# Patient Record
Sex: Female | Born: 1975 | Race: White | Hispanic: Yes | Marital: Married | Smoking: Never smoker
Health system: Southern US, Community
[De-identification: ages and names within clinical notes are randomized; demographics above are authoritative.]

## PROBLEM LIST (undated history)

## (undated) ENCOUNTER — Inpatient Hospital Stay (HOSPITAL_COMMUNITY): Payer: Self-pay

## (undated) DIAGNOSIS — O24419 Gestational diabetes mellitus in pregnancy, unspecified control: Secondary | ICD-10-CM

## (undated) DIAGNOSIS — Z789 Other specified health status: Secondary | ICD-10-CM

## (undated) HISTORY — PX: EYE MUSCLE SURGERY: SHX370

---

## 1998-01-14 ENCOUNTER — Ambulatory Visit (HOSPITAL_COMMUNITY): Admission: RE | Admit: 1998-01-14 | Discharge: 1998-01-14 | Payer: Self-pay | Admitting: Obstetrics

## 1998-05-08 ENCOUNTER — Inpatient Hospital Stay (HOSPITAL_COMMUNITY): Admission: AD | Admit: 1998-05-08 | Discharge: 1998-05-10 | Payer: Self-pay

## 1999-01-21 ENCOUNTER — Ambulatory Visit (HOSPITAL_COMMUNITY): Admission: RE | Admit: 1999-01-21 | Discharge: 1999-01-21 | Payer: Self-pay | Admitting: Obstetrics

## 1999-03-12 ENCOUNTER — Observation Stay (HOSPITAL_COMMUNITY): Admission: AD | Admit: 1999-03-12 | Discharge: 1999-03-13 | Payer: Self-pay | Admitting: *Deleted

## 1999-03-14 ENCOUNTER — Encounter: Payer: Self-pay | Admitting: *Deleted

## 1999-03-15 ENCOUNTER — Inpatient Hospital Stay (HOSPITAL_COMMUNITY): Admission: AD | Admit: 1999-03-15 | Discharge: 1999-03-21 | Payer: Self-pay | Admitting: *Deleted

## 2004-01-22 ENCOUNTER — Ambulatory Visit: Payer: Self-pay | Admitting: Internal Medicine

## 2004-02-27 ENCOUNTER — Ambulatory Visit: Payer: Self-pay | Admitting: Family Medicine

## 2005-10-24 ENCOUNTER — Ambulatory Visit: Payer: Self-pay | Admitting: Family Medicine

## 2006-01-31 ENCOUNTER — Ambulatory Visit: Payer: Self-pay | Admitting: Family Medicine

## 2006-05-01 ENCOUNTER — Ambulatory Visit (HOSPITAL_COMMUNITY): Admission: RE | Admit: 2006-05-01 | Discharge: 2006-05-01 | Payer: Self-pay | Admitting: Obstetrics & Gynecology

## 2006-05-18 ENCOUNTER — Ambulatory Visit: Payer: Self-pay | Admitting: Obstetrics & Gynecology

## 2006-05-22 ENCOUNTER — Ambulatory Visit: Payer: Self-pay | Admitting: Family Medicine

## 2006-05-29 ENCOUNTER — Ambulatory Visit: Payer: Self-pay | Admitting: Obstetrics & Gynecology

## 2006-06-05 ENCOUNTER — Ambulatory Visit: Payer: Self-pay | Admitting: Obstetrics & Gynecology

## 2006-06-14 ENCOUNTER — Ambulatory Visit: Payer: Self-pay | Admitting: Obstetrics & Gynecology

## 2006-06-19 ENCOUNTER — Ambulatory Visit: Payer: Self-pay | Admitting: Obstetrics & Gynecology

## 2006-06-28 ENCOUNTER — Ambulatory Visit: Payer: Self-pay | Admitting: Obstetrics and Gynecology

## 2006-07-03 ENCOUNTER — Ambulatory Visit: Payer: Self-pay | Admitting: Obstetrics & Gynecology

## 2006-07-10 ENCOUNTER — Ambulatory Visit: Payer: Self-pay | Admitting: Obstetrics & Gynecology

## 2006-07-17 ENCOUNTER — Ambulatory Visit: Payer: Self-pay | Admitting: Obstetrics & Gynecology

## 2006-07-24 ENCOUNTER — Ambulatory Visit: Payer: Self-pay | Admitting: Family Medicine

## 2006-07-31 ENCOUNTER — Ambulatory Visit: Payer: Self-pay | Admitting: Obstetrics and Gynecology

## 2006-08-07 ENCOUNTER — Ambulatory Visit: Payer: Self-pay | Admitting: Obstetrics & Gynecology

## 2006-08-14 ENCOUNTER — Ambulatory Visit: Payer: Self-pay | Admitting: Obstetrics & Gynecology

## 2006-08-21 ENCOUNTER — Ambulatory Visit: Payer: Self-pay | Admitting: Obstetrics & Gynecology

## 2006-08-28 ENCOUNTER — Ambulatory Visit: Payer: Self-pay | Admitting: Obstetrics & Gynecology

## 2006-09-07 ENCOUNTER — Ambulatory Visit: Payer: Self-pay | Admitting: Family Medicine

## 2006-09-09 ENCOUNTER — Ambulatory Visit: Payer: Self-pay | Admitting: *Deleted

## 2006-09-09 ENCOUNTER — Inpatient Hospital Stay (HOSPITAL_COMMUNITY): Admission: AD | Admit: 2006-09-09 | Discharge: 2006-09-10 | Payer: Self-pay | Admitting: Obstetrics & Gynecology

## 2010-03-30 ENCOUNTER — Encounter (INDEPENDENT_AMBULATORY_CARE_PROVIDER_SITE_OTHER): Payer: Self-pay | Admitting: *Deleted

## 2010-03-30 LAB — CONVERTED CEMR LAB
ALT: 12 units/L (ref 0–35)
AST: 16 units/L (ref 0–37)
Albumin: 4.3 g/dL (ref 3.5–5.2)
Alkaline Phosphatase: 63 units/L (ref 39–117)
BUN: 17 mg/dL (ref 6–23)
Basophils Absolute: 0 10*3/uL (ref 0.0–0.1)
Basophils Relative: 0 % (ref 0–1)
CO2: 24 meq/L (ref 19–32)
Calcium: 9.9 mg/dL (ref 8.4–10.5)
Chloride: 103 meq/L (ref 96–112)
Creatinine, Ser: 0.63 mg/dL (ref 0.40–1.20)
Eosinophils Absolute: 0.4 10*3/uL (ref 0.0–0.7)
Eosinophils Relative: 4 % (ref 0–5)
Glucose, Bld: 104 mg/dL — ABNORMAL HIGH (ref 70–99)
HCT: 39.8 % (ref 36.0–46.0)
Hemoglobin: 13.2 g/dL (ref 12.0–15.0)
Lymphocytes Relative: 41 % (ref 12–46)
Lymphs Abs: 4.3 10*3/uL — ABNORMAL HIGH (ref 0.7–4.0)
MCHC: 33.2 g/dL (ref 30.0–36.0)
MCV: 91.7 fL (ref 78.0–100.0)
Monocytes Absolute: 0.5 10*3/uL (ref 0.1–1.0)
Monocytes Relative: 5 % (ref 3–12)
Neutro Abs: 5.2 10*3/uL (ref 1.7–7.7)
Neutrophils Relative %: 50 % (ref 43–77)
Platelets: 323 10*3/uL (ref 150–400)
Potassium: 4.8 meq/L (ref 3.5–5.3)
RBC: 4.34 M/uL (ref 3.87–5.11)
RDW: 13 % (ref 11.5–15.5)
Sed Rate: 6 mm/hr (ref 0–22)
Sodium: 139 meq/L (ref 135–145)
Total Bilirubin: 0.2 mg/dL — ABNORMAL LOW (ref 0.3–1.2)
Total Protein: 7.2 g/dL (ref 6.0–8.3)
WBC: 10.5 10*3/uL (ref 4.0–10.5)

## 2010-05-05 ENCOUNTER — Encounter (INDEPENDENT_AMBULATORY_CARE_PROVIDER_SITE_OTHER): Payer: Self-pay | Admitting: *Deleted

## 2010-05-05 LAB — CONVERTED CEMR LAB: IgE (Immunoglobulin E), Serum: 46.5 intl units/mL (ref 0.0–180.0)

## 2010-08-27 NOTE — Discharge Summary (Signed)
[facility]  Patient:    Tricia Williams                       MRN: [id number] Adm. Date:  [id number] Disc. Date: [id number] Attending:  Michaelle Copas Dictator:   [id number]                           Discharge Summary  ATTENDING:                    Conni Elliot, M.D.  DISCHARGE DIAGNOSES:          Status post normal spontaneous vaginal delivery of a preterm infant Apgars 8 and 9.  Gestational age approximately 82 and 6/7 at delivery.  DISCHARGE MEDICATIONS:       1. Ibuprofen 800 mg q.8h. p.r.n.                               2. Prenatal vitamins one p.o. q.d.  HISTORY OF PRESENT ILLNESS:   Patient is a 35 year old G2, P1-1-0-2 who was admitted at 41 and 1/7 weeks complaining of vaginal bleeding and positive uterine contractions approximately every 10-20 minutes.  Patient did report fetal movement.  Was complaining of a pressure in her stomach.  Patient denied any leaking of fluid.  Patients past medical and OB history were otherwise noncontributory.  Patients initial examination showed her cervix to be 1 cm, 70%, and high.  HOSPITAL COURSE:              1. Status post normal spontaneous vaginal delivery.  Patient was admitted to the antenatal unit.  An ultrasound was done which showed a grade 1 placenta and an AFI was within normal limits.  Patient was started on IV fluids and given subcutaneous terbutaline.  Patient, however, continued to have contractions and some cervical change was noted so patient was placed on magnesium and Unasyn for preterm labor.  Patient was continued on strict bed rest.  Magnesium was continued.  Patient remained afebrile; however, patient continued to have some uterine contractions and continued to have cervical change.  On March 19, 1999 patients cervix was 7 cm, 85%, and -1.  Therefore, patient was determined to be in active labor and was transferred to labor and delivery.  In labor and delivery patient  was given Stadol and Phenergan for pain and later given an epidural. Patient artificially ruptured her membranes that showed very white meconium.  Patient proceeded to have a normal spontaneous vaginal delivery of a viable female infant in a vertex presentation with Apgars of 9 and 9.  Patient had a routine postpartum course.  On discharge patient was breast and bottle feeding and received Depo-Provera prior to discharge for birth control.  Patient and baby were both discharged in stable condition. DD:  05/24/99 TD:  05/24/99 Job: [id number]

## 2011-06-25 ENCOUNTER — Ambulatory Visit: Payer: Self-pay | Admitting: Family Medicine

## 2011-06-25 VITALS — BP 111/66 | HR 56 | Temp 98.9°F | Resp 20 | Ht 61.0 in | Wt 139.2 lb

## 2011-06-25 DIAGNOSIS — IMO0002 Reserved for concepts with insufficient information to code with codable children: Secondary | ICD-10-CM

## 2011-06-25 DIAGNOSIS — Z9109 Other allergy status, other than to drugs and biological substances: Secondary | ICD-10-CM

## 2011-06-25 DIAGNOSIS — J309 Allergic rhinitis, unspecified: Secondary | ICD-10-CM

## 2011-06-25 DIAGNOSIS — L509 Urticaria, unspecified: Secondary | ICD-10-CM

## 2011-06-25 MED ORDER — HYDROXYZINE PAMOATE 50 MG PO CAPS: ORAL_CAPSULE | ORAL | Status: DC

## 2011-06-25 MED ORDER — CEPHALEXIN 500 MG PO CAPS: 500.0000 mg | ORAL_CAPSULE | Freq: Three times a day (TID) | ORAL | Status: AC

## 2011-06-25 NOTE — Patient Instructions (Addendum)
Ronchas (Hives) Las ronchas (urticaria) son manchas rojas hinchadas en la piel, que pican. Pueden cambiar de tamao, forma, ubicacin y Physiological scientist. Las ronchas que se producen en las capas profundas de la piel pueden causar inflamacin en las manos, los pies y [place]. Las ronchas pueden ser una reaccin alrgica a algo que usted o su hijo hayan comido, tocado o hayan colocado sobre la piel. Las ronchas tambin pueden producirse como reaccin al fro, al calor, a las infecciones virales, a las drogas, a las picaduras de [place], o a las situaciones de Librarian, academic. Es frecuente que no se Oceanographer. Las ronchas pueden durar desde [address] a Psychologist, educational. No es un trastorno contagioso. INSTRUCCIONES PARA EL CUIDADO DOMICILIARIO  Si conoce la causa de las ronchas, evite exponerse a esa fuente.   Para aliviar la picazn y la erupcin:   Aplique compresas fras sobre la piel o tome baos de agua fra. No tome ni haga tomar a su hijo baos o duchas calientes porque el calor puede empeorar la picazn.   El mejor medicamento para las ronchas es el antihistamnico. El antihistamnico no curar las ronchas, Biomedical engineer las reducir. Puede utilizar antihistamnicos de [employer]. Este medicamento podr adormecer a su hijo. Los adolescentes no Doctor, hospital al [employer].   Utilice antihistamnico cada 6 horas o hasta que las ronchas hayan desaparecido completamente durante 24 horas, o segn le hayan indicado.   Podrn prescribirle otros medicamentos a su hijo para Associate Professor. Dle los medicamentos a su hijo Chief Operating Officer que lo asiste.   Usted o su hijo deben usar ropa suelta, inclusive la ropa interior. Las irritaciones de la piel pueden empeorar las ronchas.   Realice el seguimiento segn las instrucciones que le ha dado el profesional que lo asiste.  SOLICITE ATENCIN MDICA SI:  Usted o su hijo an sienten una picazn considerable  despus de [employer] (prescriptos o de Sales promotion account executive).   Existe hinchazn o dolor en las articulaciones.  SOLICITE ATENCIN MDICA DE INMEDIATO SI:  Tiene fiebre.   Nota inflamacin en los labios o en la lengua.   Existe dificultad al respirar o tragar, o siente "falta de espacio" en la garganta o en el pecho.   Siente dolor abdominal (en el vientre).   El nio acta como si estuviera enfermo.  Pueden ser los primeros signos de una reaccin alrgica que ponga en peligro la vida. ESTO ES UNA EMERGENCIA. Pida ayuda mdica al 911. EST SEGURO QUE:   Comprende las instrucciones para el alta mdica.   Controlar su enfermedad.   Solicitar atencin mdica de inmediato segn las indicaciones.  Document Released: 03/28/2005 Document Revised: 03/17/2011 [facility] Patient Information 2012 [place], [place].  Alergias, en general (Allergies, Generic) El profesional que lo asiste le ha diagnosticado que usted padece de [place]. Las [employer] pueden ser ocasionadas por cualquier cosa a la que su organismo es sensible. Pueden ser alimentos, medicamentos, polen, sustancias qumicas y casi cualquiera de las cosas que lo rodean en su vida diaria que producen alrgenos. Un alrgeno es todo lo que hace que una sustancia produzca alergia. La herencia es uno de los factores que causa este problema. Esto significa que usted puede sufrir alguna de las alergias que sufrieron sus [place]. Las [employer] a la comida pueden ocurrir a Actuary. Estn entre las ms graves y Engineering geologist en peligro la vida. Algunos de los alimentos que comnmente producen alergia son la [place]  de vaca, los frutos de mar, los Bird[place]in[place], los frutos secos, el trigo y la soja. SNTOMAS  Hinchazn alrededor de la boca.   Una erupcin roja que produce picazn o urticaria.   Vmitos o diarrea.   Dificultad para respirar.  LAS REACCIONES ALRGICAS GRAVES PONEN EN PELIGRO LA VIDA . Esta reaccin se denomina anafilaxis.  Puede ocasionar que la boca y la garganta se hinchen y produzca dificultad para respirar y Engineer, manufacturing. En reacciones graves, slo una pequea cantidad del alimento (por ejemplo, aceite de cacahuate en la ensalada) puede producir la muerte en pocos segundos. Las [employer]nic[employer] pueden ocurrir a Actuary. Se denominan as porque generalmente se producen durante la misma estacin todos los aos. Puede ser Neomia Dear reaccin al moho, al polen del csped o al polen de los rboles. Otras causas del problema son los alrgenos que contienen los caros del polvo del hogar, el pelaje de las mascotas y las esporas del moho. Los sntomas consisten en congestin nasal, picazn y secrecin nasal asociada con estornudos, y lagrimeo y [employer] ojos. Tambin puede haber picazn de la boca y los odos. Estos problemas aparecen cuando se entra en contacto con el polen y otros alrgenos. Los alrgenos son las partculas que estn en el aire y a las que el organismo reacciona cuando existe una Automotive engineer. Esto hace que usted libere anticuerpos alrgicos. A travs de una cadena de eventos, estos finalmente hacen que usted libere histamina en la corriente sangunea. Aunque esto implica una proteccin para su organismo, es lo que le produce disconfort. Ese es el motivo por el que se le han indicado antihistamnicos para sentirse mejor. Si usted no Counselling psychologist cul es el alrgeno que le produjo la reaccin, puede someterse a una prueba de [place] o de piel. Las alergias no pueden curarse pero pueden controlarse con medicamentos. La fiebre de heno es un grupo de trastornos alrgicos estacionales Simplemente se tratan con medicamentos de venta libre como difenhidramina (Benadryl). Tome los medicamentos segn las indicaciones. No consuma alcohol ni conduzca mientras toma este medicamento. Consulte con el profesional que lo asiste o siga las instrucciones de uso para las dosis para nios. Si estos medicamentos no le  Merchant navy officer, existen muchos otros nuevos que el profesional que lo asiste puede prescribirle. Podrn utilizarse medicamentos ms fuertes tales como un spray nasal, colirios y corticoides si los primeros medicamentos que prueba no lo Strawberry. Si todos estos fracasan, puede Chemical engineer otros tratamientos como la inmunoterapia o las inyecciones desensibilizantes. Haga una consulta de seguimiento con el profesional que lo asiste si los problemas continan. Estas alergias estacionales no ponen en peligro la vida. Generalmente se trata de una incomodidad que puede aliviarse con medicamentos. INSTRUCCIONES PARA EL CUIDADO DOMICILIARIO  Si no est seguro de que es lo que le produce la reaccin, Audiological scientist un registro de los alimentos que come y los sntomas que le siguen. Evite los Personal assistant.   Si presenta urticaria o una erupcin cutnea:   Tome los medicamentos como se le indic.   Puede utilizar un antihistamnico de venta libre (difenhidramina) para la urticaria y Higher education careers adviser, segn sea necesario.   Aplquese compresas sobre la piel o tome baos de agua fra. Evite los baos o las duchas calientes. El calor puede hacer que la urticaria y la picazn empeoren.   Si usted es muy alrgico:   Como consecuencia de un tratamiento para una reaccin grave, puede necesitar ser hospitalizado para recibir un seguimiento intensivo.  Utilice un brazalete o collar de alerta mdico, indicando que usted es Best boy.   Usted y su familia deben aprender a Building services engineer adrenalina o a Chemical engineer un kit anafilctico.   Si usted ya ha sufrido una reaccin grave, siempre lleve el kit anafilctico o el EpiPen con usted. Si sufre una reaccin grave, utilice esta medicacin del modo en que se lo indic el profesional que lo asiste. Una falla puede conllevar consecuencias fatales.  SOLICITE ATENCIN MDICA SI:  Sospecha que puede sufrir una alergia a algn alimento. Los sntomas generalmente ocurren  dentro de los 30 minutos posteriores a haber ingerido el alimento.   Los sntomas persistieron durante 2 [address]  [address] o han empeorado.   Desarrolla nuevos sntomas.   Quiere volver a probar o que su hijo consuma nuevamente un alimento o bebida que usted cree que le causa una reaccin Counselling psychologist. Nunca lo haga si ha sufrido una reaccin anafilctica a ese alimento o a esa bebida con anterioridad. Slo intntelo bajo la supervisin del mdico.  SOLICITE ATENCIN MDICA DE INMEDIATO SI:  Presenta dificultad para respirar, jadea o tiene una sensacin de opresin en el pecho o en la garganta.   Tiene la boca hinchada, o presenta urticaria, hinchazn o picazn en todo el cuerpo.   Ha sufrido una reaccin grave que ha respondido a Engineer, manufacturing systems o al EpiPen. Estas reacciones pueden volver a presentarse cuando haya terminado la medicacin. Estas reacciones deben considerarse como que ponen en peligro la vida.  EST SEGURO QUE:   Comprende las instrucciones para el alta mdica.   Controlar su enfermedad.   Solicitar atencin mdica de inmediato segn las indicaciones.  Document Released: 03/28/2005 Document Revised: 03/17/2011 [facility] Patient Information 2012 [place], [place].   Used the allergy pills when necessary. Consider trying over the counter Zyrtec. If the rash gets worse come back in for a recheck. Although we can send for allergy testing, often we do not find the cause of analogy. Your radio had a lot of testing done, and I do not want you to spend lots of money if no answers are going to be found. I think it is best to just try keep taking the medications.  Take the antibiotic for the thumb. If it keeps giving you troubles come back and get rechecked. I think it will clear up with this.

## 2011-06-25 NOTE — Progress Notes (Signed)
Subjective:   patient is in here today for 2 things. First of all she has a paronychia by her right thumb which has been bothering her for 2 or 3 months. She has treatment female back but has not been on any medicines for it.  She's been having problems with allergies. They occur intermittently and may last only 5 minutes. Sometimes can be quite bad. She has been to several doctors, and brought in with her a couple of sheets of allergy testing. All of these were negative for the various allergens tested for. It itches a lot. She is being treated with prednisone which helped and also has a prescription for Vistaril which has helped. She has not tried any over-the-counter Zyrtec. She worries about what is causing this.  Objective: Patient has a scattered very faint urticarial rash, mostly on her left arm. She does have some dermatographia. No other major rashes are seen today. She has a paronychia present over right phone laterally. It is fairly mild at this time. Does not need I and D.  Assessment: Urticaria/allergies Paronychia  Plan: Will treat her with antibiotics for the infected film. We'll continue her on some hydroxyzine for when necessary use for the rash. If he gets too bad she can him come back in. Also suggested she might try some OTC Zyrtec. We'll give her some handouts in Spanish on allergies and urticaria that she can study.

## 2011-10-09 ENCOUNTER — Telehealth: Payer: Self-pay

## 2011-10-09 NOTE — Telephone Encounter (Signed)
Patient came into the office today to get some lab results that she brought in to [place] on 06/25/11 when she was seen. Her chart is a daily from 2006 and I am not sure where to get those documents from. She has an appt on 10/10/2011 at 10am that she need the documents for so she will not have to repeat any of the tests. Please contact her as soon as possible.

## 2011-10-10 ENCOUNTER — Telehealth: Payer: Self-pay

## 2011-10-11 NOTE — Telephone Encounter (Signed)
Unable to locate requested labs. Not sure if she still needed labs or not. No one has called back needed still needing them. Tried calling patient but no answer.

## 2012-01-02 NOTE — Telephone Encounter (Signed)
DONE

## 2014-07-02 LAB — CYTOLOGY - PAP: Pap: NEGATIVE

## 2016-04-11 NOTE — L&D Delivery Note (Signed)
Patient is 41 y.o. [id number] [redacted]w[redacted]d admitted SOL. S/p augmentation with Pitocin. AROM at 1040.  Prenatal course also complicated by J1HER.  Delivery Note At 3:34 PM a viable female was delivered via Vaginal, Spontaneous Delivery (Presentation: OA).  APGAR: 9, 9; weight 6 lb 5.9 oz (2890 g).   Placenta status: Appears Intact, will send to pathology due to small size.  Cord: 3V with the following complications: None.  Cord pH: N/A  Anesthesia:  Epidural Episiotomy: None Lacerations: None Est. Blood Loss (mL): 100  Mom to postpartum.  Baby to Couplet care / Skin to Skin  Luiz Blare, DO OB Fellow

## 2016-06-30 ENCOUNTER — Inpatient Hospital Stay (HOSPITAL_COMMUNITY): Admission: RE | Admit: 2016-06-30 | Payer: Self-pay | Source: Ambulatory Visit

## 2016-06-30 ENCOUNTER — Other Ambulatory Visit (HOSPITAL_COMMUNITY): Payer: Self-pay | Admitting: Nurse Practitioner

## 2016-06-30 DIAGNOSIS — Z3682 Encounter for antenatal screening for nuchal translucency: Secondary | ICD-10-CM

## 2016-06-30 DIAGNOSIS — O09511 Supervision of elderly primigravida, first trimester: Secondary | ICD-10-CM

## 2016-06-30 LAB — OB RESULTS CONSOLE HEPATITIS B SURFACE ANTIGEN: HEP B S AG: NEGATIVE

## 2016-06-30 LAB — OB RESULTS CONSOLE ABO/RH: RH Type: POSITIVE

## 2016-06-30 LAB — OB RESULTS CONSOLE GC/CHLAMYDIA
Chlamydia: NEGATIVE
GC PROBE AMP, GENITAL: NEGATIVE

## 2016-06-30 LAB — OB RESULTS CONSOLE PLATELET COUNT: Platelets: 287 10*3/uL

## 2016-06-30 LAB — GLUCOSE TOLERANCE, 1 HOUR (50G) W/O FASTING: GLUCOSE 1 HOUR GTT: 149

## 2016-06-30 LAB — OB RESULTS CONSOLE HGB/HCT, BLOOD
HCT: 37 %
HEMOGLOBIN: 12.3 g/dL

## 2016-06-30 LAB — OB RESULTS CONSOLE VARICELLA ZOSTER ANTIBODY, IGG: VARICELLA IGG: IMMUNE

## 2016-06-30 LAB — OB RESULTS CONSOLE ANTIBODY SCREEN: ANTIBODY SCREEN: NEGATIVE

## 2016-06-30 LAB — OB RESULTS CONSOLE RUBELLA ANTIBODY, IGM: Rubella: IMMUNE

## 2016-06-30 LAB — OB RESULTS CONSOLE RPR: RPR: NONREACTIVE

## 2016-07-04 ENCOUNTER — Other Ambulatory Visit: Payer: Self-pay

## 2016-07-04 LAB — GLUCOSE TOLERANCE, 3 HOURS
Glucose, GTT - 1 Hour: 163 mg/dL (ref ?–200)
Glucose, GTT - 2 Hour: 175 mg/dL — AB (ref ?–140)
Glucose, GTT - 3 Hour: 182 mg/dL — AB (ref ?–140)
Glucose, GTT - Fasting: 92 mg/dL (ref 80–110)

## 2016-07-06 ENCOUNTER — Encounter (HOSPITAL_COMMUNITY): Payer: Self-pay | Admitting: *Deleted

## 2016-07-07 ENCOUNTER — Ambulatory Visit (HOSPITAL_COMMUNITY)
Admission: RE | Admit: 2016-07-07 | Discharge: 2016-07-07 | Disposition: A | Discharge status: Home / Self Care | Payer: Medicaid Other | Source: Ambulatory Visit | Attending: Nurse Practitioner | Admitting: Nurse Practitioner

## 2016-07-07 ENCOUNTER — Encounter (HOSPITAL_COMMUNITY): Payer: Self-pay

## 2016-07-07 ENCOUNTER — Other Ambulatory Visit (HOSPITAL_COMMUNITY): Payer: Self-pay

## 2016-07-07 ENCOUNTER — Ambulatory Visit (HOSPITAL_COMMUNITY): Payer: Self-pay

## 2016-07-07 DIAGNOSIS — Z3682 Encounter for antenatal screening for nuchal translucency: Secondary | ICD-10-CM

## 2016-07-07 DIAGNOSIS — Z3A13 13 weeks gestation of pregnancy: Secondary | ICD-10-CM | POA: Insufficient documentation

## 2016-07-07 DIAGNOSIS — Z368A Encounter for antenatal screening for other genetic defects: Secondary | ICD-10-CM | POA: Insufficient documentation

## 2016-07-07 DIAGNOSIS — Z315 Encounter for genetic counseling: Secondary | ICD-10-CM | POA: Diagnosis present

## 2016-07-07 DIAGNOSIS — O09529 Supervision of elderly multigravida, unspecified trimester: Secondary | ICD-10-CM

## 2016-07-07 DIAGNOSIS — O09511 Supervision of elderly primigravida, first trimester: Secondary | ICD-10-CM

## 2016-07-07 HISTORY — DX: Other specified health status: Z78.9

## 2016-07-07 NOTE — Progress Notes (Signed)
Genetic Counseling  High-Risk Gestation Note  Appointment Date:  07/07/2016 Referred By: Jolaine Click, NP Date of Birth:  [birth year] Partner: Janell Quiet   Pregnancy History: [id number] Estimated Date of Delivery: 01/07/17 Estimated Gestational Age: [redacted]w[redacted]d Attending: Griffin Dakin, MD  Tricia Williams was seen for genetic counseling because of a maternal age of 41 y.o..   [facility] Spanish/English interpreter, Alis, provided interpretation for today's visit.   In summary:  Discussed AMA and associated risk for fetal aneuploidy  Discussed options for screening  First screen- declined  Quad screen- declined  NIPS- elected to pursue today (Panorama)  Ultrasound- NT performed today and within normal range; offer detailed anatomy ultrasound in second trimester (not scheduled today)  Discussed diagnostic testing options  Amniocentesis - declined  Reviewed family history concerns- father of the baby's brother has Down syndrome; see discussion below   She was counseled regarding maternal age and the association with risk for chromosome conditions due to nondisjunction with aging of the ova.   We reviewed chromosomes, nondisjunction, and the associated 1 in 45 risk for fetal aneuploidy related to a maternal age of 41 y.o. at [redacted]w[redacted]d gestation.  She was counseled that the risk for aneuploidy decreases as gestational age increases, accounting for those pregnancies which spontaneously abort.  We specifically discussed Down syndrome (trisomy 59), trisomies 21 and 59, and sex chromosome aneuploidies (47,XXX and 47,XXY) including the common features and prognoses of each.   We reviewed available screening options including First Screen, Quad screen, noninvasive prenatal screening (NIPS)/cell free DNA (cfDNA) screening, and detailed ultrasound.  She was counseled that screening tests are used to modify a patient's a priori risk for aneuploidy, typically based on age. This estimate  provides a pregnancy specific risk assessment. We reviewed the benefits and limitations of each option. Specifically, we discussed the conditions for which each test screens, the detection rates, and false positive rates of each. She was also counseled regarding diagnostic testing via amniocentesis. We reviewed the approximate 1 in 322-025 risk for complications from amniocentesis, including spontaneous pregnancy loss. We discussed the possible results that the tests might provide including: positive, negative, unanticipated, and no result. Finally, they were counseled regarding the cost of each option and potential out of pocket expenses.  After consideration of all the options, she elected to proceed with NIPS (Panorama through [facility] laboratory).  Those results will be available in 8-10 days.  She declined amniocentesis.   A nuchal translucency ultrasound was performed today.  The report will be documented separately.  A detailed ultrasound is available to the patient at ~18+ weeks gestation.  No follow-up was scheduled at this time.  She understands that screening tests cannot rule out all birth defects or genetic syndromes. The patient was advised of this limitation and states she still does not want additional testing at this time.   Both family histories were reviewed and found to be contributory for Down syndrome for the father of the pregnancy's brother. The patient reported that his maternal half-brother, who is the youngest of all the siblings has Down syndrome. He had heart surgery after birth and was described to have facial features consistent with other individuals with Down syndrome. He is currently 41 years old. The patient did not have information regarding the specific karyotype/type of Down syndrome for this relative. We discussed that 95% of cases of Down syndrome are not inherited and are the result of non-disjunction.  Three to 4% of cases of Down syndrome are  the result of a  translocation involving chromosome #21.  We discussed the option of chromosome analysis to determine if an individual is a carrier of a balanced translocation involving chromosome #21.  If an individual carries a balanced translocation involving chromosome #21, then the chance to have a baby with Down syndrome would be greater than the maternal age-related risk. We discussed that the reported family history is most suggestive of sporadically occurring Down syndrome, in which case the most accurate risk assessment for the current pregnancy would be related to the previously discussed maternal age related risks. Without further information regarding the provided family history, an accurate genetic risk cannot be calculated. Further genetic counseling is warranted if more information is obtained.  Ms. Kerston I Williams denied exposure to environmental toxins or chemical agents. She denied the use of alcohol, tobacco or street drugs. She denied significant viral illnesses during the course of her pregnancy. Her medical and surgical histories were noncontributory.   I counseled Tricia Williams regarding the above risks and available options.  The approximate face-to-face time with the genetic counselor was 50 minutes.  Chipper Oman, MS,  Certified Genetic Counselor 07/07/2016

## 2016-07-14 ENCOUNTER — Encounter: Payer: Self-pay | Admitting: *Deleted

## 2016-07-16 ENCOUNTER — Other Ambulatory Visit: Payer: Self-pay

## 2016-07-18 ENCOUNTER — Ambulatory Visit: Payer: Self-pay | Admitting: *Deleted

## 2016-07-18 ENCOUNTER — Encounter: Payer: Medicaid Other | Attending: Obstetrics & Gynecology | Admitting: *Deleted

## 2016-07-18 ENCOUNTER — Telehealth (HOSPITAL_COMMUNITY): Payer: Self-pay | Admitting: MS"

## 2016-07-18 DIAGNOSIS — Z3A00 Weeks of gestation of pregnancy not specified: Secondary | ICD-10-CM | POA: Diagnosis not present

## 2016-07-18 DIAGNOSIS — Z713 Dietary counseling and surveillance: Secondary | ICD-10-CM | POA: Diagnosis not present

## 2016-07-18 DIAGNOSIS — O09529 Supervision of elderly multigravida, unspecified trimester: Secondary | ICD-10-CM | POA: Diagnosis not present

## 2016-07-18 DIAGNOSIS — O24410 Gestational diabetes mellitus in pregnancy, diet controlled: Secondary | ICD-10-CM | POA: Diagnosis present

## 2016-07-18 NOTE — Progress Notes (Signed)
  Patient was seen on 07/18/2016 for Gestational Diabetes self-management . She speaks Spanish, we had an interpretor for this visit. She states no history of GDM, her EDD is September 2018. She states her 41 yo daughter is assisting with her care during this pregnancy. 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 She states her husband is nervous about her work schedule and she wants to know if she should quit. I instructed her to ask her MD about this. The following learning objectives were met by the patient :   States the definition of Gestational Diabetes  States why dietary management is important in controlling blood glucose  Describes the effects of carbohydrates on blood glucose levels  Demonstrates ability to create a balanced meal plan  Demonstrates carbohydrate counting   States when to check blood glucose levels  Demonstrates proper blood glucose monitoring techniques  States the effect of stress and exercise on blood glucose levels  States the importance of limiting caffeine and abstaining from alcohol and smoking  Plan:  Aim for 3 Carb Choices per meal (45 grams) +/- 1 either way  Aim for 1-2 Carbs per snack Begin reading food labels for Total Carbohydrate of foods Consider  increasing your activity level by walking or other activity daily as tolerated Begin checking BG before breakfast and 2 hours after first bite of breakfast, lunch and dinner as directed by MD  Take medication if directed by MD  Blood glucose monitor given: True Trak Lot # [id number] Exp: 07/30/2018 Blood glucose reading: 89 mg/dl Patient instructed to test pre breakfast and 2 hours each meal as directed by MD Bring Log Book to every medical appointment   Patient instructed to monitor glucose levels: FBS: 60 - <90 2 hour: <120  Patient received the following handouts:  Nutrition Diabetes and  Pregnancy in Spanish  Carbohydrate Counting List in [place]  Patient will be seen for follow-up as needed.

## 2016-07-18 NOTE — Telephone Encounter (Signed)
Attempted to contact patient regarding results of noninvasive prenatal screening (NIPS)/Panorama, which were within normal limits. Patient did not answer. Left message via [place] [phone] for patient to return call.   Santiago Glad Kobi Mario 07/18/2016 10:11 AM

## 2016-07-20 ENCOUNTER — Encounter: Payer: Self-pay | Admitting: *Deleted

## 2016-07-21 ENCOUNTER — Encounter (HOSPITAL_COMMUNITY): Payer: Self-pay

## 2016-07-21 ENCOUNTER — Other Ambulatory Visit (HOSPITAL_COMMUNITY): Payer: Self-pay

## 2016-07-25 ENCOUNTER — Telehealth (HOSPITAL_COMMUNITY): Payer: Self-pay | Admitting: MS"

## 2016-07-25 NOTE — Telephone Encounter (Signed)
Patient's daughter called (with patient present on the phone) to inquire about noninvasive prenatal screening results. Patient identified by name and DOB.  Mrs. Tricia Williams had Panorama testing through [facility] laboratories.  Testing was offered because of advanced maternal age.   We reviewed that these are within normal limits, showing a less than 1 in 10,000 risk for trisomies 21, 18 and 13, and monosomy X (Turner syndrome).  In addition, the risk for triploidy and sex chromosome trisomies (47,XXX and 47,XXY) was also low risk.  We reviewed that this testing identifies > 99% of pregnancies with trisomy 52, trisomy 61, sex chromosome trisomies (47,XXX and 47,XXY), and triploidy. The detection rate for trisomy 18 is 96%.  The detection rate for monosomy X is ~92%.  The false positive rate is <0.1% for all conditions. Testing was also consistent with female fetal sex.  The patient did wish to know fetal sex.  She understands that this testing does not identify all genetic conditions.  All questions were answered to her satisfaction, she was encouraged to call with additional questions or concerns.  Chipper Oman, MS Certified Genetic Counselor 07/25/2016 12:32 PM

## 2016-08-01 ENCOUNTER — Ambulatory Visit (INDEPENDENT_AMBULATORY_CARE_PROVIDER_SITE_OTHER): Payer: Medicaid Other | Admitting: Obstetrics & Gynecology

## 2016-08-01 ENCOUNTER — Encounter: Payer: Self-pay | Admitting: Obstetrics & Gynecology

## 2016-08-01 DIAGNOSIS — O09522 Supervision of elderly multigravida, second trimester: Secondary | ICD-10-CM | POA: Diagnosis not present

## 2016-08-01 DIAGNOSIS — O24419 Gestational diabetes mellitus in pregnancy, unspecified control: Secondary | ICD-10-CM

## 2016-08-01 DIAGNOSIS — O09212 Supervision of pregnancy with history of pre-term labor, second trimester: Secondary | ICD-10-CM | POA: Diagnosis not present

## 2016-08-01 DIAGNOSIS — O0992 Supervision of high risk pregnancy, unspecified, second trimester: Secondary | ICD-10-CM

## 2016-08-01 DIAGNOSIS — O09899 Supervision of other high risk pregnancies, unspecified trimester: Secondary | ICD-10-CM

## 2016-08-01 DIAGNOSIS — O09219 Supervision of pregnancy with history of pre-term labor, unspecified trimester: Secondary | ICD-10-CM

## 2016-08-01 DIAGNOSIS — O0990 Supervision of high risk pregnancy, unspecified, unspecified trimester: Secondary | ICD-10-CM

## 2016-08-01 MED ORDER — ASPIRIN EC 81 MG PO TBEC: 81.0000 mg | DELAYED_RELEASE_TABLET | Freq: Every day | ORAL | 6 refills | Status: DC

## 2016-08-01 NOTE — Progress Notes (Signed)
[place]     PRENATAL VISIT NOTE  Subjective:  Tricia Williams is a 41 y.o. [phone] at [redacted]w[redacted]d being seen today for ongoing prenatal care.  She is currently monitored for the following issues for this high-risk pregnancy and has Environmental allergies; Advanced maternal age in multigravida; and Supervision of high risk pregnancy, antepartum on her problem list.  Patient reports no complaints.  Contractions: Not present. Vag. Bleeding: None.   . Denies leaking of fluid.   The following portions of the patient's history were reviewed and updated as appropriate: allergies, current medications, past family history, past medical history, past social history, past surgical history and problem list. Problem list updated.  Objective:   Vitals:   08/01/16 1005  BP: (!) 101/57  Pulse: (!) 53  Weight: 144 lb 14.4 oz (65.7 kg)    Fetal Status: Fetal Heart Rate (bpm): 144         General:  Alert, oriented and cooperative. Patient is in no acute distress.  Skin: Skin is warm and dry. No rash noted.   Cardiovascular: Normal heart rate noted  Respiratory: Normal respiratory effort, no problems with respiration noted  Abdomen: Soft, gravid, appropriate for gestational age. Pain/Pressure: Absent     Pelvic:  Cervical exam deferred        Extremities: Normal range of motion.  Edema: None  Mental Status: Normal mood and affect. Normal behavior. Normal judgment and thought content.   Assessment and Plan:  Pregnancy: [id number] at [redacted]w[redacted]d  1. Supervision of high risk pregnancy, antepartum - [place] MFM OB COMP + 14 WK; Future - AFP, Serum, Open Spina Bifida  2.  GDM 2 elevated fastgs this past week.  A few elevated pp in 120s this pat week.  Continue diet.  Pt should stop the flan at night. Start baby asa  3.  History of preterm delivery Pt needs 17P--had preterm delviery in 2000 then full term delivery in 2008 while receiveing 17P  Preterm labor symptoms and general  obstetric precautions including but not limited to vaginal bleeding, contractions, leaking of fluid and fetal movement were reviewed in detail with the patient. Please refer to After Visit Summary for other counseling recommendations.  Return in about 3 weeks (around 08/22/2016).   Guss Bunde, MD

## 2016-08-01 NOTE — Progress Notes (Signed)
M[facility]a application faxed.   

## 2016-08-01 NOTE — Progress Notes (Signed)
Scheduled a appointment for patient at [facility] for May 9th at 8:30

## 2016-08-03 NOTE — Progress Notes (Signed)
Contacted [facility] in regards to changing 17p order from SQ auto-injector to the IM injection.  Representative advised me to cross out and initial original SQ order, check IM injection, update the date, and refax.  Followed recommendations via representative and re-faxed the [facility] application.

## 2016-08-04 LAB — AFP, SERUM, OPEN SPINA BIFIDA
AFP MoM: 1.07
AFP Value: 42.7 ng/mL
GEST. AGE ON COLLECTION DATE: 17.3 wk
MATERNAL AGE AT EDD: 40.7 a
OSBR Risk 1 IN: 9862
TEST RESULTS AFP: NEGATIVE
Weight: 144 [lb_av]

## 2016-08-09 ENCOUNTER — Ambulatory Visit (INDEPENDENT_AMBULATORY_CARE_PROVIDER_SITE_OTHER): Payer: Self-pay | Admitting: *Deleted

## 2016-08-09 DIAGNOSIS — O09212 Supervision of pregnancy with history of pre-term labor, second trimester: Secondary | ICD-10-CM

## 2016-08-09 DIAGNOSIS — Z8751 Personal history of pre-term labor: Secondary | ICD-10-CM

## 2016-08-09 MED ORDER — HYDROXYPROGESTERONE CAPROATE 275 MG/1.1ML ~~LOC~~ SOAJ
275.0000 mg | Freq: Once | SUBCUTANEOUS | Status: AC
  Administered 2016-08-09: 275 mg via SUBCUTANEOUS

## 2016-08-10 DIAGNOSIS — O09219 Supervision of pregnancy with history of pre-term labor, unspecified trimester: Secondary | ICD-10-CM

## 2016-08-10 DIAGNOSIS — O09899 Supervision of other high risk pregnancies, unspecified trimester: Secondary | ICD-10-CM | POA: Insufficient documentation

## 2016-08-10 MED ORDER — HYDROXYPROGESTERONE CAPROATE 250 MG/ML IM OIL
250.0000 mg | TOPICAL_OIL | INTRAMUSCULAR | Status: DC
  Administered 2016-08-22 – 2016-12-08 (×16): 250 mg via INTRAMUSCULAR

## 2016-08-10 NOTE — Addendum Note (Signed)
Addended by: Donnamae Jude on: 08/10/2016 08:33 AM   Modules accepted: Orders

## 2016-08-16 ENCOUNTER — Ambulatory Visit (INDEPENDENT_AMBULATORY_CARE_PROVIDER_SITE_OTHER): Payer: Self-pay | Admitting: *Deleted

## 2016-08-16 DIAGNOSIS — O09219 Supervision of pregnancy with history of pre-term labor, unspecified trimester: Principal | ICD-10-CM

## 2016-08-16 DIAGNOSIS — O09899 Supervision of other high risk pregnancies, unspecified trimester: Secondary | ICD-10-CM

## 2016-08-16 DIAGNOSIS — O09212 Supervision of pregnancy with history of pre-term labor, second trimester: Secondary | ICD-10-CM

## 2016-08-16 MED ORDER — HYDROXYPROGESTERONE CAPROATE 275 MG/1.1ML ~~LOC~~ SOAJ
275.0000 mg | Freq: Once | SUBCUTANEOUS | Status: AC
  Administered 2016-08-16: 275 mg via SUBCUTANEOUS

## 2016-08-17 ENCOUNTER — Encounter (HOSPITAL_COMMUNITY): Payer: Self-pay

## 2016-08-17 ENCOUNTER — Other Ambulatory Visit: Payer: Self-pay | Admitting: Obstetrics & Gynecology

## 2016-08-17 ENCOUNTER — Ambulatory Visit (HOSPITAL_COMMUNITY)
Admission: RE | Admit: 2016-08-17 | Discharge: 2016-08-17 | Disposition: A | Discharge status: Home / Self Care | Payer: Self-pay | Source: Ambulatory Visit | Attending: Obstetrics & Gynecology | Admitting: Obstetrics & Gynecology

## 2016-08-17 ENCOUNTER — Other Ambulatory Visit (HOSPITAL_COMMUNITY): Payer: Self-pay | Admitting: *Deleted

## 2016-08-17 DIAGNOSIS — O09212 Supervision of pregnancy with history of pre-term labor, second trimester: Secondary | ICD-10-CM

## 2016-08-17 DIAGNOSIS — Z369 Encounter for antenatal screening, unspecified: Secondary | ICD-10-CM

## 2016-08-17 DIAGNOSIS — O322XX0 Maternal care for transverse and oblique lie, not applicable or unspecified: Secondary | ICD-10-CM | POA: Insufficient documentation

## 2016-08-17 DIAGNOSIS — O24312 Unspecified pre-existing diabetes mellitus in pregnancy, second trimester: Secondary | ICD-10-CM | POA: Insufficient documentation

## 2016-08-17 DIAGNOSIS — Z3A19 19 weeks gestation of pregnancy: Secondary | ICD-10-CM | POA: Insufficient documentation

## 2016-08-17 DIAGNOSIS — O09522 Supervision of elderly multigravida, second trimester: Secondary | ICD-10-CM

## 2016-08-17 DIAGNOSIS — O24410 Gestational diabetes mellitus in pregnancy, diet controlled: Secondary | ICD-10-CM

## 2016-08-17 DIAGNOSIS — O09892 Supervision of other high risk pregnancies, second trimester: Secondary | ICD-10-CM

## 2016-08-17 DIAGNOSIS — O0990 Supervision of high risk pregnancy, unspecified, unspecified trimester: Secondary | ICD-10-CM

## 2016-08-17 DIAGNOSIS — O09529 Supervision of elderly multigravida, unspecified trimester: Secondary | ICD-10-CM

## 2016-08-17 NOTE — Progress Notes (Signed)
Pt reports leaking a very small amount of clear watery fluid off and on x 3 weeks.

## 2016-08-18 ENCOUNTER — Ambulatory Visit (INDEPENDENT_AMBULATORY_CARE_PROVIDER_SITE_OTHER): Payer: Self-pay | Admitting: Obstetrics & Gynecology

## 2016-08-18 VITALS — BP 110/58 | HR 69 | Temp 98.6°F

## 2016-08-18 DIAGNOSIS — Z889 Allergy status to unspecified drugs, medicaments and biological substances status: Secondary | ICD-10-CM

## 2016-08-18 DIAGNOSIS — O09212 Supervision of pregnancy with history of pre-term labor, second trimester: Secondary | ICD-10-CM

## 2016-08-18 DIAGNOSIS — O09899 Supervision of other high risk pregnancies, unspecified trimester: Secondary | ICD-10-CM

## 2016-08-18 DIAGNOSIS — O09219 Supervision of pregnancy with history of pre-term labor, unspecified trimester: Secondary | ICD-10-CM

## 2016-08-18 MED ORDER — PREDNISONE 5 MG PO TABS: 20.0000 mg | ORAL_TABLET | Freq: Every day | ORAL | 0 refills | Status: DC

## 2016-08-18 NOTE — Progress Notes (Signed)
   PRENATAL VISIT NOTE  Subjective:  Tricia Williams is a 41 y.o. [phone] at [redacted]w[redacted]d being seen today for a problem visit after a reaction from recent 17P [place] injection.  Due to language barrier, a Spanish video interpreter was present during the history-taking and subsequent discussion (and for part of the physical exam) with this patient.  She is currently monitored for the following issues for this high-risk pregnancy and has Environmental allergies; Advanced maternal age in multigravida; Supervision of high risk pregnancy, antepartum; and History of preterm delivery, currently pregnant on her problem list.  Patient reports having a small bump at the site of her first Proven[place] injection last week.  2 days ago, she got a second shot and had moderate pain after injection. A day later, she noticed redness around the site and intense itching. No fevers, no blisters, some pain.  Worried she is reacting to this [place] formulation; had no reaction to IM formulation.  Contractions: Not present. Vag. Bleeding: None.  Denies leaking of fluid.   The following portions of the patient's history were reviewed and updated as appropriate: allergies, current medications, past family history, past medical history, past social history, past surgical history and problem list. Problem list updated.  Objective:   Vitals:   08/18/16 1311  BP: (!) 110/58  Pulse: 69  Temp: 98.6 F (37 C)    Fetal Status: Fetal Heart Rate (bpm): 167         General:  Alert, oriented and cooperative. Patient is in no acute distress.  Skin: Skin is warm and dry. No rash noted.   Cardiovascular: Normal heart rate noted  Respiratory: Normal respiratory effort, no problems with respiration noted  Abdomen: Soft, gravid, appropriate for gestational age. Pain/Pressure: Absent     Pelvic:  Cervical exam deferred        Extremities: Normal range of motion.  Large area of erythema surrounding injection site on right underarm area, no crepitus,  no collections palpated, mild warmth. Mildly tender to touch.   Mental Status: Normal mood and affect. Normal behavior. Normal judgment and thought content.   Assessment and Plan:  Pregnancy: [id number] at [redacted]w[redacted]d  1. 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  Will give Prednisone, no signs of cellulitis for now.  Advised that she can use Benadryl/hydrocortisone cream for itching. Told to return for worsening symptoms. Will switch back to IM formulation for 17P given reaction. - predniSONE (DELTASONE) 5 MG tablet; Take 4 tablets (20 mg total) by mouth daily with breakfast.  Dispense: 20 tablet; Refill: 0  2. History of preterm delivery, currently pregnant Continue weekly 17P injection. Preterm labor symptoms and general obstetric precautions including but not limited to vaginal bleeding, contractions, leaking of fluid and fetal movement were reviewed in detail with the patient. Please refer to After Visit Summary for other counseling recommendations.  Return in about 1 week (around 08/25/2016) for As scheduled.   Osborne Oman, MD

## 2016-08-18 NOTE — Patient Instructions (Addendum)
Regrese a la clinica cuando tenga su cita. Si tiene problemas o preguntas, llama a la clinica o vaya a la sala de [place].   Benadryl or Hydrocortisone

## 2016-08-18 NOTE — Progress Notes (Signed)
Video Interpreter # [phone]  Contacted [facility] in regards to pt that she has had a rxn to the auto-injector SQ 17p.  I also asked in regards to a new prescription form that was faxed that requested IM injection

## 2016-08-22 ENCOUNTER — Encounter: Payer: Self-pay | Admitting: Obstetrics and Gynecology

## 2016-08-22 ENCOUNTER — Ambulatory Visit (INDEPENDENT_AMBULATORY_CARE_PROVIDER_SITE_OTHER): Payer: Self-pay | Admitting: Obstetrics and Gynecology

## 2016-08-22 VITALS — BP 119/56 | HR 86 | Wt 143.3 lb

## 2016-08-22 DIAGNOSIS — Z789 Other specified health status: Secondary | ICD-10-CM | POA: Insufficient documentation

## 2016-08-22 DIAGNOSIS — O0992 Supervision of high risk pregnancy, unspecified, second trimester: Secondary | ICD-10-CM

## 2016-08-22 DIAGNOSIS — O09899 Supervision of other high risk pregnancies, unspecified trimester: Secondary | ICD-10-CM

## 2016-08-22 DIAGNOSIS — O09219 Supervision of pregnancy with history of pre-term labor, unspecified trimester: Secondary | ICD-10-CM

## 2016-08-22 DIAGNOSIS — O09212 Supervision of pregnancy with history of pre-term labor, second trimester: Secondary | ICD-10-CM

## 2016-08-22 DIAGNOSIS — O24410 Gestational diabetes mellitus in pregnancy, diet controlled: Secondary | ICD-10-CM

## 2016-08-22 DIAGNOSIS — O24419 Gestational diabetes mellitus in pregnancy, unspecified control: Secondary | ICD-10-CM | POA: Insufficient documentation

## 2016-08-22 DIAGNOSIS — O0990 Supervision of high risk pregnancy, unspecified, unspecified trimester: Secondary | ICD-10-CM

## 2016-08-22 DIAGNOSIS — O09522 Supervision of elderly multigravida, second trimester: Secondary | ICD-10-CM

## 2016-08-22 LAB — POCT URINALYSIS DIP (DEVICE)
Bilirubin Urine: NEGATIVE
Glucose, UA: NEGATIVE mg/dL
HGB URINE DIPSTICK: NEGATIVE
Ketones, ur: NEGATIVE mg/dL
Leukocytes, UA: NEGATIVE
Nitrite: NEGATIVE
PH: 7 (ref 5.0–8.0)
PROTEIN: NEGATIVE mg/dL
SPECIFIC GRAVITY, URINE: 1.02 (ref 1.005–1.030)
Urobilinogen, UA: 1 mg/dL (ref 0.0–1.0)

## 2016-08-22 NOTE — Progress Notes (Signed)
Spanish Interpreter # Iver Nestle

## 2016-08-22 NOTE — Progress Notes (Signed)
Prenatal Visit Note Date: 08/22/2016 Clinic: [facility]  Subjective:  Tricia Williams is a 41 y.o. [phone] at [redacted]w[redacted]d being seen today for ongoing prenatal care.  She is currently monitored for the following issues for this high-risk pregnancy and has Environmental allergies; Advanced maternal age in multigravida; Supervision of high risk pregnancy, antepartum; History of preterm delivery, currently pregnant; GDM, class A1; and Language barrier on her problem list.  Patient reports no complaints.   Contractions: Not present. Vag. Bleeding: None.   . Denies leaking of fluid.   The following portions of the patient's history were reviewed and updated as appropriate: allergies, current medications, past family history, past medical history, past social history, past surgical history and problem list. Problem list updated.  Objective:   Vitals:   08/22/16 0752  BP: (!) 119/56  Pulse: 86  Weight: 143 lb 4.8 oz (65 kg)    Fetal Status: Fetal Heart Rate (bpm): 160         General:  Alert, oriented and cooperative. Patient is in no acute distress.  Skin: Skin is warm and dry. No rash noted.   Cardiovascular: Normal heart rate noted  Respiratory: Normal respiratory effort, no problems with respiration noted  Abdomen: Soft, gravid, appropriate for gestational age. Pain/Pressure: Absent     Pelvic:  Cervical exam deferred        Extremities: Normal range of motion.  Edema: None  Mental Status: Normal mood and affect. Normal behavior. Normal judgment and thought content.   Urinalysis:      Assessment and Plan:  Pregnancy: [id number] at [redacted]w[redacted]d  1. Supervision of high risk pregnancy, antepartum Routine care. Anatomy scan, afp neg  2. History of preterm delivery, currently pregnant 17p today. Continue with qwk injections. Pt told to apply topical hydrocortisone cream after injection to see if that cuts down on inj site rxn. Normal CL at anatomy u/s  3. Elderly  multigravida in second trimester Baseline labs today. Continue baby ASA - TSH - Comprehensive metabolic panel - Protein / creatinine ratio, urine  4. GDM, class A1 Normal BS log with no medications. Baseline a1c today. Already has surveillance growth scans scheduled - Hemoglobin A1c  Interpreter used.   Preterm labor symptoms and general obstetric precautions including but not limited to vaginal bleeding, contractions, leaking of fluid and fetal movement were reviewed in detail with the patient. Please refer to After Visit Summary for other counseling recommendations.  Return in about 1 week (around 08/29/2016).   Aletha Halim, MD

## 2016-08-23 ENCOUNTER — Ambulatory Visit: Payer: Medicaid Other

## 2016-08-23 LAB — PROTEIN / CREATININE RATIO, URINE
Creatinine, Urine: 136.9 mg/dL
PROTEIN UR: 15.5 mg/dL
Protein/Creat Ratio: 113 mg/g creat (ref 0–200)

## 2016-08-23 LAB — COMPREHENSIVE METABOLIC PANEL
ALT: 11 IU/L (ref 0–32)
AST: 9 IU/L (ref 0–40)
Albumin/Globulin Ratio: 1.2 (ref 1.2–2.2)
Albumin: 3.6 g/dL (ref 3.5–5.5)
Alkaline Phosphatase: 63 IU/L (ref 39–117)
BUN/Creatinine Ratio: 18 (ref 9–23)
BUN: 10 mg/dL (ref 6–24)
Bilirubin Total: <0.2 mg/dL (ref 0.0–1.2)
CALCIUM: 9.4 mg/dL (ref 8.7–10.2)
CO2: 20 mmol/L (ref 18–29)
CREATININE: 0.56 mg/dL — AB (ref 0.57–1.00)
Chloride: 105 mmol/L (ref 96–106)
GFR calc Af Amer: 135 mL/min/{1.73_m2} (ref >59)
GFR, EST NON AFRICAN AMERICAN: 117 mL/min/{1.73_m2} (ref >59)
GLOBULIN, TOTAL: 2.9 g/dL (ref 1.5–4.5)
Glucose: 114 mg/dL — ABNORMAL HIGH (ref 65–99)
Potassium: 3.9 mmol/L (ref 3.5–5.2)
Sodium: 138 mmol/L (ref 134–144)
TOTAL PROTEIN: 6.5 g/dL (ref 6.0–8.5)

## 2016-08-23 LAB — TSH: TSH: 1.47 u[IU]/mL (ref 0.450–4.500)

## 2016-08-23 LAB — HGB A1C W/O EAG: Hgb A1c MFr Bld: 5.2 % (ref 4.8–5.6)

## 2016-08-29 ENCOUNTER — Ambulatory Visit (INDEPENDENT_AMBULATORY_CARE_PROVIDER_SITE_OTHER): Payer: Self-pay | Admitting: *Deleted

## 2016-08-29 VITALS — BP 106/49 | HR 66

## 2016-08-29 DIAGNOSIS — O09219 Supervision of pregnancy with history of pre-term labor, unspecified trimester: Secondary | ICD-10-CM

## 2016-08-29 DIAGNOSIS — O09899 Supervision of other high risk pregnancies, unspecified trimester: Secondary | ICD-10-CM

## 2016-08-29 DIAGNOSIS — O09212 Supervision of pregnancy with history of pre-term labor, second trimester: Secondary | ICD-10-CM

## 2016-08-29 DIAGNOSIS — O0990 Supervision of high risk pregnancy, unspecified, unspecified trimester: Secondary | ICD-10-CM

## 2016-08-29 NOTE — Progress Notes (Signed)
C/o after getting shots in arm that arm gets " hot and itchy" for about 10 minutes. We discussed is a local reaction and she can continue injections unless she elects not to. Discussed if she has itching all over or rash all over should stop injections. Also discussed with provider Jorje Guild, NP)and she agrees with plan. She elects to continue injections. Advised her she can come into office and show [place] the reaction if she desires.

## 2016-09-06 ENCOUNTER — Ambulatory Visit (INDEPENDENT_AMBULATORY_CARE_PROVIDER_SITE_OTHER): Payer: Self-pay | Admitting: General Practice

## 2016-09-06 DIAGNOSIS — O09219 Supervision of pregnancy with history of pre-term labor, unspecified trimester: Principal | ICD-10-CM

## 2016-09-06 DIAGNOSIS — O09212 Supervision of pregnancy with history of pre-term labor, second trimester: Secondary | ICD-10-CM

## 2016-09-06 DIAGNOSIS — O09899 Supervision of other high risk pregnancies, unspecified trimester: Secondary | ICD-10-CM

## 2016-09-06 NOTE — Progress Notes (Signed)
Patient here for makena injection today. States on Friday she developed a rash on her lower right leg near her ankle. Discussed with that that it is likely not related to the injections because she would've had a reaction way before then and she would've developed the rash around the injection site as well. Patient verbalized understanding.

## 2016-09-14 ENCOUNTER — Encounter (HOSPITAL_COMMUNITY): Payer: Self-pay

## 2016-09-14 ENCOUNTER — Ambulatory Visit (INDEPENDENT_AMBULATORY_CARE_PROVIDER_SITE_OTHER): Payer: Self-pay | Admitting: *Deleted

## 2016-09-14 ENCOUNTER — Ambulatory Visit (HOSPITAL_COMMUNITY)
Admission: RE | Admit: 2016-09-14 | Discharge: 2016-09-14 | Disposition: A | Discharge status: Home / Self Care | Payer: Self-pay | Source: Ambulatory Visit | Attending: Obstetrics & Gynecology | Admitting: Obstetrics & Gynecology

## 2016-09-14 ENCOUNTER — Other Ambulatory Visit (HOSPITAL_COMMUNITY): Payer: Self-pay | Admitting: Obstetrics and Gynecology

## 2016-09-14 VITALS — BP 101/45 | HR 59 | Wt 143.0 lb

## 2016-09-14 DIAGNOSIS — O3412 Maternal care for benign tumor of corpus uteri, second trimester: Secondary | ICD-10-CM | POA: Insufficient documentation

## 2016-09-14 DIAGNOSIS — O09899 Supervision of other high risk pregnancies, unspecified trimester: Secondary | ICD-10-CM

## 2016-09-14 DIAGNOSIS — O09522 Supervision of elderly multigravida, second trimester: Secondary | ICD-10-CM

## 2016-09-14 DIAGNOSIS — Z3A23 23 weeks gestation of pregnancy: Secondary | ICD-10-CM

## 2016-09-14 DIAGNOSIS — O09219 Supervision of pregnancy with history of pre-term labor, unspecified trimester: Secondary | ICD-10-CM

## 2016-09-14 DIAGNOSIS — O24312 Unspecified pre-existing diabetes mellitus in pregnancy, second trimester: Secondary | ICD-10-CM

## 2016-09-14 DIAGNOSIS — O09212 Supervision of pregnancy with history of pre-term labor, second trimester: Secondary | ICD-10-CM | POA: Insufficient documentation

## 2016-09-14 DIAGNOSIS — D259 Leiomyoma of uterus, unspecified: Secondary | ICD-10-CM | POA: Insufficient documentation

## 2016-09-14 DIAGNOSIS — O09529 Supervision of elderly multigravida, unspecified trimester: Secondary | ICD-10-CM

## 2016-09-14 NOTE — Progress Notes (Signed)
Makena 250 mg IM administered as scheduled.  Pt tolerated well.  

## 2016-09-14 NOTE — Addendum Note (Signed)
Encounter addended by: Abigail Butts on: 09/14/2016  9:05 AM<BR>    Actions taken: Imaging Exam ended

## 2016-09-21 ENCOUNTER — Ambulatory Visit (INDEPENDENT_AMBULATORY_CARE_PROVIDER_SITE_OTHER): Payer: Self-pay | Admitting: Obstetrics and Gynecology

## 2016-09-21 VITALS — BP 109/53 | HR 59 | Wt 143.5 lb

## 2016-09-21 DIAGNOSIS — O0990 Supervision of high risk pregnancy, unspecified, unspecified trimester: Secondary | ICD-10-CM

## 2016-09-21 DIAGNOSIS — O09899 Supervision of other high risk pregnancies, unspecified trimester: Secondary | ICD-10-CM

## 2016-09-21 DIAGNOSIS — O09219 Supervision of pregnancy with history of pre-term labor, unspecified trimester: Secondary | ICD-10-CM

## 2016-09-21 DIAGNOSIS — O24410 Gestational diabetes mellitus in pregnancy, diet controlled: Secondary | ICD-10-CM

## 2016-09-21 DIAGNOSIS — Z789 Other specified health status: Secondary | ICD-10-CM

## 2016-09-21 DIAGNOSIS — O09212 Supervision of pregnancy with history of pre-term labor, second trimester: Secondary | ICD-10-CM

## 2016-09-21 DIAGNOSIS — O09522 Supervision of elderly multigravida, second trimester: Secondary | ICD-10-CM

## 2016-09-21 DIAGNOSIS — O0992 Supervision of high risk pregnancy, unspecified, second trimester: Secondary | ICD-10-CM

## 2016-09-21 NOTE — Progress Notes (Signed)
Prenatal Visit Note Date: 09/21/2016 Clinic: [facility]  Subjective:  Tricia Williams is a 41 y.o. [phone] at [redacted]w[redacted]d being seen today for ongoing prenatal care.  She is currently monitored for the following issues for this high-risk pregnancy and has Environmental allergies; Advanced maternal age in multigravida; Supervision of high risk pregnancy, antepartum; History of preterm delivery, currently pregnant; GDM, class A1; and Language barrier on her problem list.  Patient reports no complaints.   Contractions: Not present. Vag. Bleeding: None.  Movement: Absent. Denies leaking of fluid.   The following portions of the patient's history were reviewed and updated as appropriate: allergies, current medications, past family history, past medical history, past social history, past surgical history and problem list. Problem list updated.  Objective:   Vitals:   09/21/16 0755  BP: (!) 109/53  Pulse: (!) 59  Weight: 143 lb 8 oz (65.1 kg)    Fetal Status: Fetal Heart Rate (bpm): 135   Movement: Absent     General:  Alert, oriented and cooperative. Patient is in no acute distress.  Skin: Skin is warm and dry. No rash noted.   Cardiovascular: Normal heart rate noted  Respiratory: Normal respiratory effort, no problems with respiration noted  Abdomen: Soft, gravid, appropriate for gestational age. Pain/Pressure: Present     Pelvic:  Cervical exam deferred        Extremities: Normal range of motion.  Edema: None  Mental Status: Normal mood and affect. Normal behavior. Normal judgment and thought content.   Urinalysis:      Assessment and Plan:  Pregnancy: [id number] at [redacted]w[redacted]d  1. Supervision of high risk pregnancy, antepartum Routine care.   2. GDM, class A1 Normal BS log on no medications. Normal growth, ac, afi last month. Has rpt scheduled already.   3. Language barrier Interpreter used  4. History of preterm delivery, currently pregnant 17p today  5.  Elderly multigravida in second trimester No issues.   Preterm labor symptoms and general obstetric precautions including but not limited to vaginal bleeding, contractions, leaking of fluid and fetal movement were reviewed in detail with the patient. Please refer to After Visit Summary for other counseling recommendations.  Return in about 1 week (around 09/28/2016) for for 17p and 2-3wks for rob.   Aletha Halim, MD

## 2016-09-21 NOTE — Progress Notes (Signed)
17-p today Spanish video interpreter "Prisilla" [phone] use for visit

## 2016-09-27 ENCOUNTER — Ambulatory Visit (INDEPENDENT_AMBULATORY_CARE_PROVIDER_SITE_OTHER): Payer: Self-pay

## 2016-09-27 VITALS — BP 104/55 | HR 52

## 2016-09-27 DIAGNOSIS — O09212 Supervision of pregnancy with history of pre-term labor, second trimester: Secondary | ICD-10-CM

## 2016-09-27 DIAGNOSIS — O0990 Supervision of high risk pregnancy, unspecified, unspecified trimester: Secondary | ICD-10-CM

## 2016-09-27 NOTE — Progress Notes (Signed)
Patient presented to office today for 17-p injection.

## 2016-10-04 ENCOUNTER — Ambulatory Visit (INDEPENDENT_AMBULATORY_CARE_PROVIDER_SITE_OTHER): Payer: Self-pay | Admitting: General Practice

## 2016-10-04 DIAGNOSIS — O09212 Supervision of pregnancy with history of pre-term labor, second trimester: Secondary | ICD-10-CM

## 2016-10-04 DIAGNOSIS — O09899 Supervision of other high risk pregnancies, unspecified trimester: Secondary | ICD-10-CM

## 2016-10-04 DIAGNOSIS — O09219 Supervision of pregnancy with history of pre-term labor, unspecified trimester: Principal | ICD-10-CM

## 2016-10-05 ENCOUNTER — Ambulatory Visit: Payer: Self-pay

## 2016-10-13 ENCOUNTER — Ambulatory Visit (INDEPENDENT_AMBULATORY_CARE_PROVIDER_SITE_OTHER): Payer: Self-pay | Admitting: Obstetrics & Gynecology

## 2016-10-13 ENCOUNTER — Encounter: Payer: Self-pay | Admitting: Obstetrics & Gynecology

## 2016-10-13 VITALS — BP 97/56 | HR 56 | Wt 142.0 lb

## 2016-10-13 DIAGNOSIS — O09212 Supervision of pregnancy with history of pre-term labor, second trimester: Secondary | ICD-10-CM

## 2016-10-13 DIAGNOSIS — O09522 Supervision of elderly multigravida, second trimester: Secondary | ICD-10-CM

## 2016-10-13 DIAGNOSIS — Z23 Encounter for immunization: Secondary | ICD-10-CM

## 2016-10-13 DIAGNOSIS — O0992 Supervision of high risk pregnancy, unspecified, second trimester: Secondary | ICD-10-CM

## 2016-10-13 DIAGNOSIS — O24410 Gestational diabetes mellitus in pregnancy, diet controlled: Secondary | ICD-10-CM

## 2016-10-13 DIAGNOSIS — O0990 Supervision of high risk pregnancy, unspecified, unspecified trimester: Secondary | ICD-10-CM

## 2016-10-13 DIAGNOSIS — O09899 Supervision of other high risk pregnancies, unspecified trimester: Secondary | ICD-10-CM

## 2016-10-13 DIAGNOSIS — O09219 Supervision of pregnancy with history of pre-term labor, unspecified trimester: Secondary | ICD-10-CM

## 2016-10-13 DIAGNOSIS — O09523 Supervision of elderly multigravida, third trimester: Secondary | ICD-10-CM

## 2016-10-13 LAB — POCT URINALYSIS DIP (DEVICE)
Bilirubin Urine: NEGATIVE
Glucose, UA: NEGATIVE mg/dL
Hgb urine dipstick: NEGATIVE
Ketones, ur: NEGATIVE mg/dL
NITRITE: NEGATIVE
PH: 7.5 (ref 5.0–8.0)
Protein, ur: NEGATIVE mg/dL
Specific Gravity, Urine: 1.015 (ref 1.005–1.030)
Urobilinogen, UA: 0.2 mg/dL (ref 0.0–1.0)

## 2016-10-13 NOTE — Progress Notes (Signed)
   PRENATAL VISIT NOTE  Subjective:  Tricia Williams is a 41 y.o. [phone] at [redacted]w[redacted]d being seen today for ongoing prenatal care. Patient is Spanish-speaking only, Spanish interpreter present for this encounter.  She is currently monitored for the following issues for this high-risk pregnancy and has Environmental allergies; Advanced maternal age in multigravida; Supervision of high risk pregnancy, antepartum; History of preterm delivery, currently pregnant; GDM, class A1; and Language barrier on her problem list.  Patient reports fatigue, bruising episode last week  Contractions: Not present. Vag. Bleeding: None.  Movement: (!) Decreased (feels move at night, very busy during day). Denies leaking of fluid.   The following portions of the patient's history were reviewed and updated as appropriate: allergies, current medications, past family history, past medical history, past social history, past surgical history and problem list. Problem list updated.  Objective:   Vitals:   10/13/16 0857  BP: (!) 97/56  Pulse: (!) 56  Weight: 142 lb (64.4 kg)    Fetal Status: Fetal Heart Rate (bpm): 137 Fundal Height: 28 cm Movement: (!) Decreased     General:  Alert, oriented and cooperative. Patient is in no acute distress.  Skin: Skin is warm and dry. No rash noted.   Cardiovascular: Normal heart rate noted  Respiratory: Normal respiratory effort, no problems with respiration noted  Abdomen: Soft, gravid, appropriate for gestational age. Pain/Pressure: Present     Pelvic:  Cervical exam deferred        Extremities: Normal range of motion.  Edema: None  Mental Status: Normal mood and affect. Normal behavior. Normal judgment and thought content.   Assessment and Plan:  Pregnancy: [id number] at [redacted]w[redacted]d  1. Diet controlled gestational diabetes mellitus (GDM) in third trimester BS reviewed; had a few fasting values in 100s, normal PP. Recommended Glyburide, she wants to try to tighten diet and  exercise.  Will reevaluate next visit. Ultrasound scheduled for next week. Third trimester labs today, Tdap given.  Will follow up CBC given report of fatigue and bruising. - POCT urinalysis dip (device) - CBC - RPR - HIV antibody (with reflex) - Tdap vaccine greater than or equal to 7yo IM  2. History of preterm delivery, currently pregnant Continue weekly 17P  3. Elderly multigravida in third trimester 4. Supervision of high risk pregnancy, antepartum Preterm labor symptoms and general obstetric precautions including but not limited to vaginal bleeding, contractions, leaking of fluid and fetal movement were reviewed in detail with the patient. Please refer to After Visit Summary for other counseling recommendations.  Return in about 1 week (around 10/20/2016) for 17P only    2 weeks:17P and HOB.   Verita Schneiders, MD

## 2016-10-13 NOTE — Patient Instructions (Signed)
Regrese a la clinica cuando tenga su cita. Si tiene problemas o preguntas, llama a la clinica o vaya a la sala de [place].   Tercer trimestre de Media planner (Third Trimester of Pregnancy) El tercer trimestre comprende desde la [id number] hasta la [id number], es decir, desde el mes7 hasta el mes9. El tercer trimestre es un perodo en el que el feto crece rpidamente. Hacia el final del noveno mes, el feto mide alrededor de 20pulgadas (45cm) de largo y pesa entre 6 y 59 libras (2,700 y 47,500kg). CAMBIOS EN EL ORGANISMO Su organismo atraviesa por muchos cambios durante el [place], y estos varan de Ardelia Mems mujer a Theatre manager.  Seguir [employer]. Es de esperar que aumente entre 25 y 35libras (65 y 16kg) hacia el final del Media planner.  Podrn aparecer las primeras [employer] caderas, el abdomen y las [place].  Puede tener necesidad de Garment/textile technologist con ms frecuencia porque el feto baja hacia la pelvis y ejerce presin sobre la vejiga.  Debido al Glennis Brink podr sentir Victorio Palm estomacal con frecuencia.  Puede estar estreida, ya que ciertas hormonas enlentecen los movimientos de los msculos que [employer] desechos a travs de los intestinos.  Pueden aparecer hemorroides o abultarse e hincharse las venas (venas varicosas).  Puede sentir dolor plvico debido al [employer]onic y a que las hormonas del Scientist, research (life sciences) las articulaciones entre los huesos de la pelvis. El dolor de espalda puede ser consecuencia de la sobrecarga de los msculos que soportan la [place].  Tal vez haya cambios en el cabello que pueden incluir su engrosamiento, crecimiento rpido y cambios en la textura. Adems, a algunas mujeres se les cae el cabello durante o despus del embarazo, o tienen el cabello seco o fino. Lo ms probable es que el cabello se le normalice despus del nacimiento del beb.  Las [employer] seguirn creciendo y Teaching laboratory technician. A veces, puede haber una secrecin amarilla de las mamas  llamada calostro.  El ombligo puede salir hacia afuera.  Puede sentir que le falta el aire debido a que se expande el tero.  Puede notar que el feto "baja" o lo siente ms bajo, en el abdomen.  Puede tener una prdida de secrecin mucosa con sangre. Esto suele ocurrir en el trmino de unos pocos das a una semana antes de que comience el [place].  El cuello del tero se vuelve delgado y blando (se borra) cerca de la fecha de [place]. QU DEBE ESPERAR EN LOS EXMENES PRENATALES Le harn exmenes prenatales cada 2semanas hasta la semana36. A partir de ese momento le harn exmenes semanales. Durante una visita prenatal de rutina:  La pesarn para asegurarse de que usted y el feto estn creciendo normalmente.  Le tomarn la presin arterial.  Le medirn el abdomen para controlar el desarrollo del beb.  Se escucharn los latidos cardacos fetales.  Se evaluarn los resultados de los estudios solicitados en visitas anteriores.  Le revisarn el cuello del tero cuando est prxima la fecha de parto para controlar si este se ha borrado. Alrededor de la semana36, el mdico le revisar el cuello del tero. Al mismo tiempo, realizar un anlisis de las secreciones del tejido vaginal. Este examen es para determinar si hay un tipo de bacteria, estreptococo Grupo B. El mdico le explicar esto con ms detalle. El mdico puede preguntarle lo siguiente:  Cmo le gustara que fuera el [place].  Cmo se siente.  Si siente los movimientos del beb.  Si ha  tenido sntomas anormales, como prdida de lquido, [place], dolores de cabeza intensos o clicos abdominales.  Si est consumiendo algn producto que contenga tabaco, como cigarrillos, tabaco de Higher education careers adviser y Psychologist, sport and exercise.  Si tiene [employer]. Otros exmenes o estudios de deteccin que pueden realizarse durante el tercer trimestre incluyen lo siguiente:  Anlisis de sangre para controlar los niveles de hierro  (anemia).  Controles fetales para determinar su salud, nivel de [place] y Mining engineer. Si tiene [place] enfermedad o hay problemas durante el embarazo, le harn estudios.  Prueba del VIH (virus de inmunodeficiencia humana). Si corre Electronics engineer, pueden realizarle una prueba de deteccin del VIH durante el tercer trimestre del embarazo. FALSO TRABAJO DE PARTO Es posible que sienta contracciones leves e irregulares que finalmente desaparecen. Se llaman contracciones de Braxton Hicks o falso trabajo de [place]. Las Yahoo pueden durar horas, das o incluso semanas, antes de que el verdadero trabajo de parto se inicie. Si las contracciones ocurren a intervalos regulares, se intensifican o se hacen dolorosas, lo mejor es que la revise el mdico. SIGNOS DE TRABAJO DE PARTO  Clicos de tipo menstrual.  Contracciones cada 58minutos o menos.  Contracciones que comienzan en la parte superior del tero y se extienden hacia abajo, a la zona inferior del abdomen y la espalda.  Sensacin de mayor presin en la pelvis o dolor de espalda.  Una secrecin de mucosidad acuosa o con sangre que sale de la vagina. Si tiene alguno de estos signos antes de la [id number] del Media planner, llame a su mdico de inmediato. Debe concurrir al hospital para que la controlen inmediatamente. INSTRUCCIONES PARA EL CUIDADO EN EL HOGAR  Evite fumar, consumir hierbas, beber alcohol y tomar frmacos que no le hayan recetado. Estas sustancias qumicas afectan la formacin y el desarrollo del beb.  No consuma ningn producto que contenga tabaco, lo que incluye cigarrillos, tabaco de Higher education careers adviser y Psychologist, sport and exercise. Si necesita ayuda para dejar de fumar, consulte al [employer]. Puede recibir asesoramiento y otro tipo de recursos para dejar de fumar.  [place] mdico en relacin con el uso de medicamentos. Durante el embarazo, hay medicamentos que son seguros de tomar y otros que no.  Haga ejercicio solamente  como se lo haya indicado el mdico. Sentir clicos uterinos es un buen signo para Ambulance person actividad fsica.  Contine comiendo alimentos sanos con regularidad.  Use un sostn que le brinde buen soporte si le [employer].  No se d baos de inmersin en agua caliente, baos turcos ni saunas.  Use el cinturn de seguridad en todo momento mientras conduce.  No coma carne cruda ni queso sin cocinar; evite el contacto con las bandejas sanitarias de los gatos y la tierra que estos animales usan. Estos elementos contienen grmenes que pueden causar defectos congnitos en el beb.  [place].  Tome entre 1500 y 2000mg  de calcio diariamente comenzando en la [id number] del embarazo [place].  Si est estreida, pruebe un laxante suave (si el mdico lo autoriza). Consuma ms alimentos ricos en fibra, como vegetales y frutas frescos y Psychologist, prison and probation services. Beba gran cantidad de lquido para mantener la orina de tono claro o color amarillo plido.  Dese baos de asiento con agua tibia para Best boy o las molestias causadas por las hemorroides. Use una crema para las hemorroides si el mdico la autoriza.  Si tiene venas varicosas, use medias de descanso. Eleve los pies durante 20minutos, 3 o 4veces por da. Limite  el consumo de sal en su dieta.  Evite levantar objetos pesados, use zapatos de tacones bajos y [place].  Descanse con las piernas elevadas si tiene calambres o dolor de cintura.  Visite a su dentista si no lo ha Quarry manager. Use un cepillo de dientes blando para higienizarse los dientes y psese el hilo dental con suavidad.  Puede seguir [employer], a menos que el mdico le indique lo contrario.  No haga viajes largos excepto que sea absolutamente necesario y solo con la autorizacin del [place] clases prenatales para Development worker, international aid, Psychologist, prison and probation services y hacer preguntas sobre el [place] de parto y [place].  Haga un ensayo de la partida al hospital.  Prepare el bolso que llevar al hospital.  Prepare la habitacin del beb.  Concurra a todas las visitas prenatales segn las indicaciones de su mdico.  SOLICITE ATENCIN MDICA SI:  No est segura de que est en trabajo de parto o de que ha roto la bolsa de las aguas.  Tiene mareos.  Siente clicos leves, presin en la pelvis o dolor persistente en el abdomen.  Tiene nuseas, vmitos o diarrea persistentes.  Margette Fast secrecin vaginal con mal olor.  Siente dolor al [employer].  SOLICITE ATENCIN MDICA DE INMEDIATO SI:  Tiene fiebre.  Tiene una prdida de lquido por la vagina.  Tiene sangrado o pequeas prdidas vaginales.  Siente dolor intenso o clicos en el abdomen.  Sube o baja de peso rpidamente.  Tiene dificultad para respirar y siente dolor de pecho.  Sbitamente se le hinchan mucho el rostro, las [place], los tobillos, los pies o las piernas.  No ha sentido los movimientos del beb durante Leone Brand.  Siente un dolor de cabeza intenso que no se alivia con medicamentos.  Su visin se modifica.  Esta informacin no tiene Marine scientist el consejo del mdico. Asegrese de hacerle al mdico cualquier pregunta que tenga. Document Released: 01/05/2005 Document Revised: 04/18/2014 Document Reviewed: 05/29/2012 Elsevier Interactive Patient Education  2017 [employer].

## 2016-10-14 LAB — CBC
Hematocrit: 38.3 % (ref 34.0–46.6)
Hemoglobin: 12.6 g/dL (ref 11.1–15.9)
MCH: 30.9 pg (ref 26.6–33.0)
MCHC: 32.9 g/dL (ref 31.5–35.7)
MCV: 94 fL (ref 79–97)
Platelets: 226 10*3/uL (ref 150–379)
RBC: 4.08 x10E6/uL (ref 3.77–5.28)
RDW: 14.1 % (ref 12.3–15.4)
WBC: 10.4 10*3/uL (ref 3.4–10.8)

## 2016-10-14 LAB — HIV ANTIBODY (ROUTINE TESTING W REFLEX): HIV Screen 4th Generation wRfx: NONREACTIVE

## 2016-10-14 LAB — SYPHILIS: RPR W/REFLEX TO RPR TITER AND TREPONEMAL ANTIBODIES, TRADITIONAL SCREENING AND DIAGNOSIS ALGORITHM: RPR Ser Ql: NONREACTIVE

## 2016-10-17 ENCOUNTER — Encounter (HOSPITAL_COMMUNITY): Payer: Self-pay

## 2016-10-17 ENCOUNTER — Other Ambulatory Visit (HOSPITAL_COMMUNITY): Payer: Self-pay | Admitting: Obstetrics and Gynecology

## 2016-10-17 ENCOUNTER — Ambulatory Visit (HOSPITAL_COMMUNITY)
Admission: RE | Admit: 2016-10-17 | Discharge: 2016-10-17 | Disposition: A | Discharge status: Home / Self Care | Payer: Self-pay | Source: Ambulatory Visit | Attending: Obstetrics & Gynecology | Admitting: Obstetrics & Gynecology

## 2016-10-17 DIAGNOSIS — O09529 Supervision of elderly multigravida, unspecified trimester: Secondary | ICD-10-CM

## 2016-10-17 DIAGNOSIS — O24312 Unspecified pre-existing diabetes mellitus in pregnancy, second trimester: Secondary | ICD-10-CM | POA: Insufficient documentation

## 2016-10-17 DIAGNOSIS — O09213 Supervision of pregnancy with history of pre-term labor, third trimester: Secondary | ICD-10-CM | POA: Insufficient documentation

## 2016-10-17 DIAGNOSIS — O24410 Gestational diabetes mellitus in pregnancy, diet controlled: Secondary | ICD-10-CM

## 2016-10-17 DIAGNOSIS — Z3A28 28 weeks gestation of pregnancy: Secondary | ICD-10-CM

## 2016-10-17 DIAGNOSIS — O09523 Supervision of elderly multigravida, third trimester: Secondary | ICD-10-CM | POA: Insufficient documentation

## 2016-10-19 ENCOUNTER — Ambulatory Visit (HOSPITAL_COMMUNITY): Payer: Self-pay

## 2016-10-20 ENCOUNTER — Ambulatory Visit: Payer: Self-pay

## 2016-10-21 ENCOUNTER — Ambulatory Visit (INDEPENDENT_AMBULATORY_CARE_PROVIDER_SITE_OTHER): Payer: Self-pay | Admitting: Obstetrics and Gynecology

## 2016-10-21 VITALS — BP 99/50 | HR 49 | Wt 143.9 lb

## 2016-10-21 DIAGNOSIS — N6082 Other benign mammary dysplasias of left breast: Secondary | ICD-10-CM

## 2016-10-21 DIAGNOSIS — O09899 Supervision of other high risk pregnancies, unspecified trimester: Secondary | ICD-10-CM

## 2016-10-21 DIAGNOSIS — O09213 Supervision of pregnancy with history of pre-term labor, third trimester: Secondary | ICD-10-CM

## 2016-10-21 DIAGNOSIS — O09219 Supervision of pregnancy with history of pre-term labor, unspecified trimester: Principal | ICD-10-CM

## 2016-10-21 MED ORDER — CEPHALEXIN 500 MG PO CAPS: 500.0000 mg | ORAL_CAPSULE | Freq: Three times a day (TID) | ORAL | 0 refills | Status: DC

## 2016-10-21 NOTE — Progress Notes (Signed)
Pt was seen today for NST C/O "sore" on left breast x 2 weeks  Exam consistent with small < 1 cm left sebaceous cyst   Pt isntructed on the use of warm compresses and will start Keflex. Recheck next week

## 2016-10-21 NOTE — Progress Notes (Signed)
Makena 250 mg IM administered as scheduled.  Pt tolerated well. Pt c/o having a swollen, reddened area on her Lt breast. Dr. Rip Harbour agreed to examine pt.

## 2016-10-27 ENCOUNTER — Ambulatory Visit (INDEPENDENT_AMBULATORY_CARE_PROVIDER_SITE_OTHER): Payer: Self-pay | Admitting: Obstetrics and Gynecology

## 2016-10-27 ENCOUNTER — Encounter: Payer: Self-pay | Admitting: Obstetrics and Gynecology

## 2016-10-27 ENCOUNTER — Other Ambulatory Visit: Payer: Self-pay

## 2016-10-27 VITALS — BP 111/53 | HR 58 | Wt 142.1 lb

## 2016-10-27 DIAGNOSIS — O09219 Supervision of pregnancy with history of pre-term labor, unspecified trimester: Secondary | ICD-10-CM

## 2016-10-27 DIAGNOSIS — Z758 Other problems related to medical facilities and other health care: Secondary | ICD-10-CM

## 2016-10-27 DIAGNOSIS — R001 Bradycardia, unspecified: Secondary | ICD-10-CM

## 2016-10-27 DIAGNOSIS — O09213 Supervision of pregnancy with history of pre-term labor, third trimester: Secondary | ICD-10-CM

## 2016-10-27 DIAGNOSIS — O0990 Supervision of high risk pregnancy, unspecified, unspecified trimester: Secondary | ICD-10-CM

## 2016-10-27 DIAGNOSIS — O0993 Supervision of high risk pregnancy, unspecified, third trimester: Secondary | ICD-10-CM

## 2016-10-27 DIAGNOSIS — O09523 Supervision of elderly multigravida, third trimester: Secondary | ICD-10-CM

## 2016-10-27 DIAGNOSIS — O09899 Supervision of other high risk pregnancies, unspecified trimester: Secondary | ICD-10-CM

## 2016-10-27 DIAGNOSIS — Z789 Other specified health status: Secondary | ICD-10-CM

## 2016-10-27 DIAGNOSIS — O24419 Gestational diabetes mellitus in pregnancy, unspecified control: Secondary | ICD-10-CM

## 2016-10-27 MED ORDER — GLYBURIDE 2.5 MG PO TABS: 2.5000 mg | ORAL_TABLET | Freq: Every day | ORAL | 1 refills | Status: DC

## 2016-10-27 NOTE — Progress Notes (Signed)
Prenatal Visit Note Date: 10/27/2016 Clinic: [facility]  Subjective:  Tricia Williams is a 41 y.o. [phone] at [redacted]w[redacted]d being seen today for ongoing prenatal care.  She is currently monitored for the following issues for this high-risk pregnancy and has Environmental allergies; Advanced maternal age in multigravida; Supervision of high risk pregnancy, antepartum; History of preterm delivery, currently pregnant; GDM, class A2; Language barrier; Sebaceous cyst of breast, left; and Bradycardia on her problem list.  Patient reports no complaints.   Contractions: Not present. Vag. Bleeding: None.  Movement: Present. Denies leaking of fluid.   The following portions of the patient's history were reviewed and updated as appropriate: allergies, current medications, past family history, past medical history, past social history, past surgical history and problem list. Problem list updated.  Objective:   Vitals:   10/27/16 1103  BP: (!) 111/53  Pulse: (!) 58  Weight: 142 lb 1.6 oz (64.5 kg)    Fetal Status: Fetal Heart Rate (bpm): 150   Movement: Present     General:  Alert, oriented and cooperative. Patient is in no acute distress.  Skin: Skin is warm and dry. No rash noted.   Cardiovascular: Normal heart rate noted. No MRGs, normal s1 and s2  Respiratory: Normal respiratory effort, no problems with respiration noted  Abdomen: Soft, gravid, appropriate for gestational age. Pain/Pressure: Present     Pelvic:  Cervical exam deferred        Extremities: Normal range of motion.  Edema: None  Mental Status: Normal mood and affect. Normal behavior. Normal judgment and thought content.   Urinalysis:      Assessment and Plan:  Pregnancy: [id number] at [redacted]w[redacted]d  1. Bradycardia No chest pain, sob, other s/s - EKG 12-Lead  2. Language barrier Interpreter used  3. History of preterm delivery, currently pregnant 17p today  4. Supervision of high risk pregnancy,  antepartum Routine care. Breast cyst resolving fine. No e/o infection. Pt never started the keflex. It looks like a sebaceous cyst.  - [place] MFM OB FOLLOW UP; Future - EKG 12-Lead  5. Elderly multigravida in third trimester Routine care - [place] MFM OB FOLLOW UP; Future  6. GDM, class A2 Didn't start the glyburide at last visit b/c she thought her values were high b/c of life stressors which aren't present anymore (family visit). Her AM fastings are borderline elevated but her 2hr PP are all elevated in the 130s-150s. I recommend she start glyb 2.5 with breakfast and she is amenable to this.  - [place] MFM OB FOLLOW UP; Future  7. H/o PTB 17p. Continue with qwk injections.   Preterm labor symptoms and general obstetric precautions including but not limited to vaginal bleeding, contractions, leaking of fluid and fetal movement were reviewed in detail with the patient. Please refer to After Visit Summary for other counseling recommendations.  Return in about 2 weeks (around 11/10/2016) for rob.   Aletha Halim, MD

## 2016-11-03 ENCOUNTER — Ambulatory Visit (INDEPENDENT_AMBULATORY_CARE_PROVIDER_SITE_OTHER): Payer: Self-pay | Admitting: General Practice

## 2016-11-03 DIAGNOSIS — O09899 Supervision of other high risk pregnancies, unspecified trimester: Secondary | ICD-10-CM

## 2016-11-03 DIAGNOSIS — O09219 Supervision of pregnancy with history of pre-term labor, unspecified trimester: Principal | ICD-10-CM

## 2016-11-03 DIAGNOSIS — O09213 Supervision of pregnancy with history of pre-term labor, third trimester: Secondary | ICD-10-CM

## 2016-11-09 ENCOUNTER — Ambulatory Visit (INDEPENDENT_AMBULATORY_CARE_PROVIDER_SITE_OTHER): Payer: Self-pay | Admitting: Obstetrics and Gynecology

## 2016-11-09 ENCOUNTER — Encounter: Payer: Self-pay | Admitting: Obstetrics and Gynecology

## 2016-11-09 VITALS — BP 111/56 | HR 76 | Wt 145.0 lb

## 2016-11-09 DIAGNOSIS — O0993 Supervision of high risk pregnancy, unspecified, third trimester: Secondary | ICD-10-CM

## 2016-11-09 DIAGNOSIS — O0990 Supervision of high risk pregnancy, unspecified, unspecified trimester: Secondary | ICD-10-CM

## 2016-11-09 DIAGNOSIS — O09213 Supervision of pregnancy with history of pre-term labor, third trimester: Secondary | ICD-10-CM

## 2016-11-09 DIAGNOSIS — O09899 Supervision of other high risk pregnancies, unspecified trimester: Secondary | ICD-10-CM

## 2016-11-09 DIAGNOSIS — Z789 Other specified health status: Secondary | ICD-10-CM

## 2016-11-09 DIAGNOSIS — O09523 Supervision of elderly multigravida, third trimester: Secondary | ICD-10-CM

## 2016-11-09 DIAGNOSIS — O09219 Supervision of pregnancy with history of pre-term labor, unspecified trimester: Secondary | ICD-10-CM

## 2016-11-09 DIAGNOSIS — O24419 Gestational diabetes mellitus in pregnancy, unspecified control: Secondary | ICD-10-CM

## 2016-11-09 MED ORDER — COMFORT FIT MATERNITY SUPP MED MISC: 0 refills | Status: DC

## 2016-11-09 MED ORDER — GLUCOSE BLOOD VI STRP: ORAL_STRIP | 12 refills | Status: DC

## 2016-11-09 NOTE — Progress Notes (Signed)
Subjective:  Tricia Williams is a 41 y.o. [phone] at [redacted]w[redacted]d being seen today for ongoing prenatal care.  She is currently monitored for the following issues for this high-risk pregnancy and has Environmental allergies; Advanced maternal age in multigravida; Supervision of high risk pregnancy, antepartum; History of preterm delivery, currently pregnant; GDM, class A2; Language barrier; Sebaceous cyst of breast, left; and Bradycardia on her problem list.  Patient reports have some back and pelvic pain that in intermittent.  Contractions: Not present. Vag. Bleeding: None.  Movement: Present. Denies leaking of fluid.   The following portions of the patient's history were reviewed and updated as appropriate: allergies, current medications, past family history, past medical history, past social history, past surgical history and problem list. Problem list updated.  Objective:   Vitals:   11/09/16 0952  BP: (!) 111/56  Pulse: 76  Weight: 145 lb (65.8 kg)    Fetal Status: Fetal Heart Rate (bpm): 152   Movement: Present     General:  Alert, oriented and cooperative. Patient is in no acute distress.  Skin: Skin is warm and dry. No rash noted.   Cardiovascular: Normal heart rate noted  Respiratory: Normal respiratory effort, no problems with respiration noted  Abdomen: Soft, gravid, appropriate for gestational age. Pain/Pressure: Present     Pelvic: Vag. Bleeding: None     Cervical exam deferred        Extremities: Normal range of motion.  Edema: None  Mental Status: Normal mood and affect. Normal behavior. Normal judgment and thought content.   13 days worth of CBG values Fasting - 5 elevated values PPB - 3 elevated values PPL- 3 elevated values PPD- 2 elevated values  Assessment and Plan:  Pregnancy: [id number] at [redacted]w[redacted]d  1. GDM, class A2 Noncompliant with medication. Has only taken about 5 days worth of glyburide. CBGs with elevated values per above. Continue current glyburide dose  and encouraged compliance. On ASA. Discussed risk of uncontrolled diabetes. Test strips refilled. Has growth [place] scheduled. Need to start antepartum testing at 32wks.   2. Elderly multigravida in third trimester  3. Supervision of high risk pregnancy, antepartum Routine prenatal care. Maternity support bands discussed.   4. History of preterm delivery, currently pregnant 17p today. Continue weekly.  5. Language barrier Video interpretor used.   Preterm labor symptoms and general obstetric precautions including but not limited to vaginal bleeding, contractions, leaking of fluid and fetal movement were reviewed in detail with the patient. Please refer to After Visit Summary for other counseling recommendations.  Return in about 2 weeks (around 11/23/2016) for ob visit.   Luiz Blare, DO [facility], [place] 11/09/2016, 10:25 AM

## 2016-11-09 NOTE — Patient Instructions (Addendum)
Dolor del ligamento redondo °(Round Ligament Pain) °El ligamento redondo es un cordón de músculo y tejido que sirve de sostén para el útero. Puede volverse una fuente de dolor durante el embarazo si se distiende o se torsiona a medida que el bebé crece. Generalmente, el dolor empieza en el segundo trimestre de embarazo, y puede aparecer y desaparecer hasta el momento del parto. No se trata de un problema grave y no es perjudicial para el bebé. °El dolor del ligamento redondo suele ser agudo y punzante, y durar poco tiempo, pero también puede ser sordo, persistente y continuo. Se lo percibe en la región inferior del abdomen o en la ingle. A menudo comienza en la zona más profunda de la ingle y se extiende hacia región externa de la cadera. El dolor puede aparecer en los siguientes casos: °· Al cambiar repentinamente de posición. °· Al darse vuelta en la cama. °· Al toser o estornudar. °· Al realizar actividad física. °INSTRUCCIONES PARA EL CUIDADO EN EL HOGAR °Controle su afección para ver si hay cambios. Siga estos pasos para aliviar el dolor: °· Cuando el dolor comience, relájese. Luego intente lo siguiente: °? Sentarse. °? Flexionar las rodillas hacia el abdomen. °? Acostarse de costado con una almohada debajo del abdomen y otra entre las piernas. °? Sentarse en una bañera con agua tibia durante 15 a 20 minutos o hasta que el dolor desaparezca. °· Tome los medicamentos de venta libre y los recetados solamente como se lo haya indicado el médico. °· Haga movimientos lentos al sentarse y pararse. °· No haga caminatas largas si le generan dolor. °· Suspenda o reduzca las actividades físicas si le generan dolor. °SOLICITE ATENCIÓN MÉDICA SI: °· El dolor no desaparece con el tratamiento. °· Tiene un dolor en la espalda que no tenía antes. °· El medicamento no resulta eficaz. °SOLICITE ATENCIÓN MÉDICA DE INMEDIATO SI: °· Tiene escalofríos o fiebre. °· Tiene contracciones uterinas. °· Presenta hemorragia  vaginal. °· Siente náuseas o vómitos. °· Tiene diarrea. °· Siente dolor al orinar. °Esta información no tiene como fin reemplazar el consejo del médico. Asegúrese de hacerle al médico cualquier pregunta que tenga. °Document Released: 03/10/2008 Document Revised: 06/20/2011 Document Reviewed: 06/04/2014 °Elsevier Interactive Patient Education © 2018 Elsevier Inc. ° °

## 2016-11-10 ENCOUNTER — Encounter (HOSPITAL_COMMUNITY): Payer: Self-pay

## 2016-11-10 ENCOUNTER — Ambulatory Visit (HOSPITAL_COMMUNITY)
Admission: RE | Admit: 2016-11-10 | Discharge: 2016-11-10 | Disposition: A | Discharge status: Home / Self Care | Payer: Self-pay | Source: Ambulatory Visit | Attending: Obstetrics and Gynecology | Admitting: Obstetrics and Gynecology

## 2016-11-10 ENCOUNTER — Other Ambulatory Visit (HOSPITAL_COMMUNITY): Payer: Self-pay | Admitting: *Deleted

## 2016-11-10 DIAGNOSIS — O24419 Gestational diabetes mellitus in pregnancy, unspecified control: Secondary | ICD-10-CM | POA: Insufficient documentation

## 2016-11-10 DIAGNOSIS — O24313 Unspecified pre-existing diabetes mellitus in pregnancy, third trimester: Secondary | ICD-10-CM | POA: Insufficient documentation

## 2016-11-10 DIAGNOSIS — O0990 Supervision of high risk pregnancy, unspecified, unspecified trimester: Secondary | ICD-10-CM

## 2016-11-10 DIAGNOSIS — O09213 Supervision of pregnancy with history of pre-term labor, third trimester: Secondary | ICD-10-CM | POA: Insufficient documentation

## 2016-11-10 DIAGNOSIS — O09523 Supervision of elderly multigravida, third trimester: Secondary | ICD-10-CM | POA: Insufficient documentation

## 2016-11-10 DIAGNOSIS — O0993 Supervision of high risk pregnancy, unspecified, third trimester: Secondary | ICD-10-CM | POA: Insufficient documentation

## 2016-11-10 DIAGNOSIS — Z3A31 31 weeks gestation of pregnancy: Secondary | ICD-10-CM | POA: Insufficient documentation

## 2016-11-10 HISTORY — DX: Gestational diabetes mellitus in pregnancy, unspecified control: O24.419

## 2016-11-16 ENCOUNTER — Encounter: Payer: Self-pay | Admitting: Obstetrics and Gynecology

## 2016-11-16 ENCOUNTER — Other Ambulatory Visit: Payer: Self-pay

## 2016-11-16 ENCOUNTER — Ambulatory Visit (INDEPENDENT_AMBULATORY_CARE_PROVIDER_SITE_OTHER): Payer: Self-pay | Admitting: Obstetrics and Gynecology

## 2016-11-16 VITALS — Wt 145.3 lb

## 2016-11-16 DIAGNOSIS — O0993 Supervision of high risk pregnancy, unspecified, third trimester: Secondary | ICD-10-CM

## 2016-11-16 DIAGNOSIS — O09213 Supervision of pregnancy with history of pre-term labor, third trimester: Secondary | ICD-10-CM

## 2016-11-16 DIAGNOSIS — O09899 Supervision of other high risk pregnancies, unspecified trimester: Secondary | ICD-10-CM

## 2016-11-16 DIAGNOSIS — O24419 Gestational diabetes mellitus in pregnancy, unspecified control: Secondary | ICD-10-CM

## 2016-11-16 DIAGNOSIS — O0990 Supervision of high risk pregnancy, unspecified, unspecified trimester: Secondary | ICD-10-CM

## 2016-11-16 DIAGNOSIS — O09219 Supervision of pregnancy with history of pre-term labor, unspecified trimester: Secondary | ICD-10-CM

## 2016-11-17 ENCOUNTER — Ambulatory Visit: Payer: Self-pay

## 2016-11-17 ENCOUNTER — Encounter: Payer: Self-pay | Attending: Obstetrics and Gynecology | Admitting: *Deleted

## 2016-11-17 ENCOUNTER — Ambulatory Visit (INDEPENDENT_AMBULATORY_CARE_PROVIDER_SITE_OTHER): Payer: Self-pay | Admitting: *Deleted

## 2016-11-17 ENCOUNTER — Ambulatory Visit: Payer: Self-pay | Admitting: *Deleted

## 2016-11-17 DIAGNOSIS — R7302 Impaired glucose tolerance (oral): Secondary | ICD-10-CM | POA: Insufficient documentation

## 2016-11-17 DIAGNOSIS — O24419 Gestational diabetes mellitus in pregnancy, unspecified control: Secondary | ICD-10-CM

## 2016-11-17 DIAGNOSIS — O24410 Gestational diabetes mellitus in pregnancy, diet controlled: Secondary | ICD-10-CM

## 2016-11-17 DIAGNOSIS — Z713 Dietary counseling and surveillance: Secondary | ICD-10-CM | POA: Insufficient documentation

## 2016-11-17 NOTE — Progress Notes (Signed)

## 2016-11-17 NOTE — Progress Notes (Signed)
  Patient was seen on 11/17/2016 for Gestational Diabetes self-management follow up visit . She speaks Spanish, we had an interpretor for this visit. She brought her BG Log Sheet today and it shows elevated fasting BG of 95 - 104 mg/dl until this AM which was 94 mg/dl. Post meal BG typically within target range of 120 mg/dl at 2 hours post meal with a few at 130 post supper meal. We discussed the value of adding protein to evening snack to help reduce risk of Dawn Phenomenon and thus improve FBG numbers. She did that last night and her FBG this AM was better. She had questions regarding a green shake of vegetables and green apple and pineapple which I suggested she drink half at a time and eat some nuts with it to add protein. No questions regarding her medication or self monitoring of BG.   Plan:  Aim for 3 Carb Choices per meal (45 grams) +/- 1 either way  Aim for 1-2 Carbs per snack Begin reading food labels for Total Carbohydrate of foods Consider  increasing your activity level by walking or other activity daily as tolerated Continue checking BG before breakfast and 2 hours after first bite of breakfast, lunch and dinner as directed by MD  Take medication, Glyburide 2.5 mg before breakfast as directed by MD  Patient instructed to monitor glucose levels: FBS: 60 - <95 2 hour: <120  Patient received the following handouts:  Nutrition Diabetes and Pregnancy in Spanish  Carbohydrate Counting List in [place]  Patient will be seen for follow-up as needed.

## 2016-11-18 NOTE — Progress Notes (Signed)
Subjective:  Tricia Williams is a 41 y.o. [phone] at [redacted]w[redacted]d being seen today for ongoing prenatal care.  She is currently monitored for the following issues for this high-risk pregnancy and has Environmental allergies; Advanced maternal age in multigravida; Supervision of high risk pregnancy, antepartum; History of preterm delivery, currently pregnant; GDM, class A2; Language barrier; Sebaceous cyst of breast, left; and Bradycardia on her problem list.  Patient reports no complaints.   .  .   . Denies leaking of fluid.   The following portions of the patient's history were reviewed and updated as appropriate: allergies, current medications, past family history, past medical history, past social history, past surgical history and problem list. Problem list updated.  Objective:  There were no vitals filed for this visit.  Fetal Status:           General:  Alert, oriented and cooperative. Patient is in no acute distress.  Skin: Skin is warm and dry. No rash noted.   Cardiovascular: Normal heart rate noted  Respiratory: Normal respiratory effort, no problems with respiration noted  Abdomen: Soft, gravid, appropriate for gestational age.       Pelvic:  Cervical exam deferred        Extremities: Normal range of motion.     Mental Status: Normal mood and affect. Normal behavior. Normal judgment and thought content.   Urinalysis:      Assessment and Plan:  Pregnancy: [id number] at [redacted]w[redacted]d  1. Supervision of high risk pregnancy, antepartum Stable  2. History of preterm delivery, currently pregnant Continue with 17 OHP  3. GDM, class A2 Stable But did not bring medication and does not recall strength. Epic down so unable to verify.   Please note EPIC was down during pt's visit. Note completed afterwards Pt to return tomorrow to complete antenatal testing and to bring medications for possible adjustment  Preterm labor symptoms and general obstetric precautions including but not limited  to vaginal bleeding, contractions, leaking of fluid and fetal movement were reviewed in detail with the patient. Please refer to After Visit Summary for other counseling recommendations.  No Follow-up on file.   Chancy Milroy, MD

## 2016-11-20 ENCOUNTER — Encounter (HOSPITAL_COMMUNITY): Payer: Self-pay

## 2016-11-20 ENCOUNTER — Inpatient Hospital Stay (HOSPITAL_COMMUNITY)
Admission: AD | Admit: 2016-11-20 | Discharge: 2016-11-20 | Disposition: A | Discharge status: Home / Self Care | Payer: Self-pay | Source: Ambulatory Visit | Attending: Obstetrics & Gynecology | Admitting: Obstetrics & Gynecology

## 2016-11-20 DIAGNOSIS — O09213 Supervision of pregnancy with history of pre-term labor, third trimester: Secondary | ICD-10-CM | POA: Insufficient documentation

## 2016-11-20 DIAGNOSIS — M545 Low back pain: Secondary | ICD-10-CM | POA: Insufficient documentation

## 2016-11-20 DIAGNOSIS — O09219 Supervision of pregnancy with history of pre-term labor, unspecified trimester: Secondary | ICD-10-CM

## 2016-11-20 DIAGNOSIS — O09523 Supervision of elderly multigravida, third trimester: Secondary | ICD-10-CM | POA: Insufficient documentation

## 2016-11-20 DIAGNOSIS — Z833 Family history of diabetes mellitus: Secondary | ICD-10-CM | POA: Insufficient documentation

## 2016-11-20 DIAGNOSIS — R109 Unspecified abdominal pain: Secondary | ICD-10-CM | POA: Insufficient documentation

## 2016-11-20 DIAGNOSIS — Z3A33 33 weeks gestation of pregnancy: Secondary | ICD-10-CM | POA: Insufficient documentation

## 2016-11-20 DIAGNOSIS — Z3689 Encounter for other specified antenatal screening: Secondary | ICD-10-CM

## 2016-11-20 DIAGNOSIS — O09899 Supervision of other high risk pregnancies, unspecified trimester: Secondary | ICD-10-CM

## 2016-11-20 DIAGNOSIS — Z7984 Long term (current) use of oral hypoglycemic drugs: Secondary | ICD-10-CM | POA: Insufficient documentation

## 2016-11-20 DIAGNOSIS — Z7982 Long term (current) use of aspirin: Secondary | ICD-10-CM | POA: Insufficient documentation

## 2016-11-20 DIAGNOSIS — O26893 Other specified pregnancy related conditions, third trimester: Secondary | ICD-10-CM | POA: Insufficient documentation

## 2016-11-20 DIAGNOSIS — O24419 Gestational diabetes mellitus in pregnancy, unspecified control: Secondary | ICD-10-CM | POA: Insufficient documentation

## 2016-11-20 DIAGNOSIS — O0990 Supervision of high risk pregnancy, unspecified, unspecified trimester: Secondary | ICD-10-CM

## 2016-11-20 LAB — URINALYSIS, ROUTINE W REFLEX MICROSCOPIC
BILIRUBIN URINE: NEGATIVE
Glucose, UA: NEGATIVE mg/dL
HGB URINE DIPSTICK: NEGATIVE
KETONES UR: NEGATIVE mg/dL
Leukocytes, UA: NEGATIVE
NITRITE: NEGATIVE
PROTEIN: NEGATIVE mg/dL
Specific Gravity, Urine: 1.004 — ABNORMAL LOW (ref 1.005–1.030)
pH: 7 (ref 5.0–8.0)

## 2016-11-20 LAB — GLUCOSE, CAPILLARY: GLUCOSE-CAPILLARY: 81 mg/dL (ref 65–99)

## 2016-11-20 NOTE — MAU Provider Note (Signed)
History      CSN: [id number]  Arrival date and time: 11/20/16 2136  Chief Complaint  Patient presents with  . Back Pain  . Dysuria   HPI: Patient seen with interpreter in the room.  Patient complaining of pain and pressure in her lower back and lower abdomen. The pressure started last night and has gotten progressively worse. She rates the pain as a 5/10. She has not tried any medications. At her last appointment the baby was found to be breech presentation and she describes the pain as if the baby is kicking her and moving around in her lower abdomen.  Patient states that she is not having any trouble urinating, just increased frequency. Patient says she has been drinking plenty of water.  Patient denies LOF, VB, and has +FM. Patient has also had trouble with her at home glucose monitor and stopped eating at 1600 today because she keeps getting high readings and was worried.  She has a previous pre-term delivery at 44 weeks and is on 63 OHP this pregnancy. 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   OB History    Gravida Para Term Preterm AB Living   4 3 2 1   3    SAB TAB Ectopic Multiple Live Births           2      Past Medical History:  Diagnosis Date  . Gestational diabetes   . Medical history non-contributory     Past Surgical History:  Procedure Laterality Date  . EYE MUSCLE SURGERY      Family History  Problem Relation Age of Onset  . Diabetes Father     Social History  Substance Use Topics  . Smoking status: Never Smoker  . Smokeless tobacco: Never Used  . Alcohol use No    Allergies: No Known Allergies  Facility-Administered Medications Prior to Admission  Medication Dose Route Frequency Provider Last Rate Last Dose  . hydroxyprogesterone caproate (MAKENA) 250 mg/mL injection 250 mg  250 mg Intramuscular Weekly Donnamae Jude, MD   250 mg at 11/09/16 7681   Prescriptions Prior to Admission  Medication Sig Dispense Refill Last Dose  . aspirin EC  81 MG tablet Take 1 tablet (81 mg total) by mouth daily. 30 tablet 6 Taking  . Elastic Bandages & Supports (COMFORT FIT MATERNITY SUPP MED) MISC Use as needed for support (Patient not taking: Reported on 11/10/2016) 1 each 0 Not Taking  . glucose blood test strip Use to test blood sugar 4 times a day. 100 each 12 Taking  . glyBURIDE (DIABETA) 2.5 MG tablet Take 1 tablet (2.5 mg total) by mouth daily with breakfast. 60 tablet 1 Taking  . Prenatal Vit w/Fe-Methylfol-FA (PNV PO) Take by mouth.   Taking    Review of Systems  Gastrointestinal: Positive for abdominal pain.  Genitourinary: Positive for frequency. Negative for dysuria, hematuria and vaginal bleeding.  Musculoskeletal: Positive for back pain.   Physical Exam   Blood pressure (!) 104/57, pulse (!) 56, temperature 98.7 F (37.1 C), temperature source Oral, resp. rate 16, height 5\' 1"  (1.549 m), weight 66.3 kg (146 lb 4 oz), last menstrual period 04/02/2016, SpO2 98 %.   Physical Exam  Constitutional: She is oriented to person, place, and time. She appears well-developed and well-nourished.  HENT:  Head: Normocephalic and atraumatic.  Neck: Neck supple.  Cardiovascular: Normal rate.   Respiratory: Effort normal.  GI: Soft. There is tenderness.  Genitourinary:  Genitourinary Comments: SVE  closed/60  Neurological: She is alert and oriented to person, place, and time.  Skin: Skin is warm and dry.  Psychiatric: She has a normal mood and affect. Thought content normal.  EFM: 135 bpm, mod variability, + accels, no decels Toco: rare, irritability  Results for orders placed or performed during the hospital encounter of 11/20/16 (from the past 24 hour(s))  Urinalysis, Routine w reflex microscopic     Status: Abnormal   Collection Time: 11/20/16 10:10 PM  Result Value Ref Range   Color, Urine STRAW (A) YELLOW   APPearance CLEAR CLEAR   Specific Gravity, Urine 1.004 (L) 1.005 - 1.030   pH 7.0 5.0 - 8.0   Glucose, UA NEGATIVE NEGATIVE  mg/dL   Hgb urine dipstick NEGATIVE NEGATIVE   Bilirubin Urine NEGATIVE NEGATIVE   Ketones, ur NEGATIVE NEGATIVE mg/dL   Protein, ur NEGATIVE NEGATIVE mg/dL   Nitrite NEGATIVE NEGATIVE   Leukocytes, UA NEGATIVE NEGATIVE  Glucose, capillary     Status: None   Collection Time: 11/20/16 11:11 PM  Result Value Ref Range   Glucose-Capillary 81 65 - 99 mg/dL   MAU Course  Procedures  MDM UA obtained- negative Cervical exam performed CBG 81 No evidence of PTL or UTI.   Assessment and Plan   1. Low back pain during pregnancy in third trimester   2. History of preterm delivery, currently pregnant   3. Supervision of high risk pregnancy, antepartum   4. Elderly multigravida in third trimester   5. NST (non-stress test) reactive   6. [redacted] weeks gestation of pregnancy    -Patient discharged home with educational materials -Patient instructed to call clinic Monday for a new glucometer because hers has not been working properly. Instructed to continue to eat and follow her GDM diet.  -Patient reassured that low back pain is normal during pregnancy, given her negative work up -Comfort measures discussed for back pain  Martinique Shirley 11/20/2016, 11:29 PM   I confirm that I have verified the information documented in the resident's note and that I have also personally reperformed the physical exam and all medical decision making activities.  Julianne Handler, CNM  11/20/2016 11:45 PM

## 2016-11-20 NOTE — Discharge Instructions (Signed)
Dolor abdominal en el embarazo (Abdominal Pain During Pregnancy) El dolor abdominal es frecuente durante el embarazo. Generalmente no causa ningn dao. El dolor abdominal puede tener numerosas causas. Algunas causas son ms graves que otras. Ciertas causas de dolor abdominal durante el embarazo se diagnostican fcilmente. A veces, se tarda un tiempo para llegar al diagnstico. Otras veces la causa no se conoce. El dolor abdominal puede estar relacionado con [place] alteracin del [place], o puede deberse a una causa totalmente diferente. Por este motivo, siempre consulte a su mdico cuando sienta molestias abdominales. INSTRUCCIONES PARA EL CUIDADO EN EL HOGAR  Est atenta al dolor para ver si hay cambios. Las siguientes indicaciones ayudarn a [employer] pueda sentir:  No Consulting civil engineer sexuales y no coloque nada dentro de la vagina hasta que los sntomas hayan desaparecido completamente.  Descanse todo lo que pueda [employer] dolor se le haya calmado.  Si siente nuseas, beba lquidos claros. Evite los alimentos slidos mientras sienta malestar o tenga nuseas.  Tome slo medicamentos de venta libre o recetados, segn las indicaciones del mdico.  Cumpla con todas las visitas de control, segn le indique su mdico. SOLICITE ATENCIN MDICA DE INMEDIATO SI:  Tiene un sangrado, prdida de lquidos o elimina tejidos por la vagina.  El dolor o los clicos [place].  Tiene vmitos persistentes.  Comienza a Education officer, environmental al orinar u [employer]  Tiene fiebre.  Nota que los movimientos del beb disminuyen.  Siente intensa debilidad o se marea.  Tiene dificultad para respirar con o sin dolor abdominal.  Siente un dolor de cabeza intenso junto al dolor abdominal.  Wilma Flavin secrecin vaginal anormal con dolor abdominal.  Tiene diarrea persistente.  El dolor abdominal sigue o empeora an despus de Magazine features editor. ASEGRESE DE QUE:   Comprende estas  instrucciones.  Controlar su afeccin.  Recibir ayuda de inmediato si no mejora o si empeora. Esta informacin no tiene Marine scientist el consejo del mdico. Asegrese de hacerle al mdico cualquier pregunta que tenga. Document Released: 03/28/2005 Document Revised: 07/20/2015 Elsevier Interactive Patient Education  2017 Nunda de espalda durante el embarazo (Back Pain in Pregnancy) El dolor de espalda es habitual durante el [place]. Puede deberse a varios factores relacionados con los cambios durante esta etapa. INSTRUCCIONES PARA EL CUIDADO EN EL HOGAR Control del dolor, la rigidez y la hinchazn  Si se lo indican, aplique hielo en caso de dolor de espalda repentino (agudo). ? Ponga el hielo en una bolsa plstica. ? Coloque una [employer] piel y la bolsa de hielo. ? Coloque el hielo durante 43minutos, 2 a 3veces por da.  Si se lo indican, aplique calor en la zona afectada antes de realizar ejercicios: ? Coloque una toalla entre la piel y la compresa de calor o la almohadilla trmica. ? Aplique el calor durante 20 a 80minutos. ? Retire la fuente de calor si la piel se le pone de color rojo brillante. Esto es muy importante si no puede sentir el dolor, el calor o el fro. Puede correr un riesgo mayor de sufrir quemaduras. Actividad  Haga ejercicio como se lo haya indicado el mdico. Hacer ejercicio es la mejor forma de Product/process development scientist o Financial controller de espalda.  Prstele atencin a su cuerpo cuando se levante. Si siente dolor al levantarse, pida ayuda o flexione las rodillas. [employer], se usan los msculos de las piernas en lugar de los de la espalda.  Pngase  en cuclillas al levantar algo del suelo. No se agache.  Haga reposo en cama nicamente como se lo haya indicado el mdico. El reposo en cama solo debe hacerse cuando los episodios de dolor de espalda son ms intensos. Pararse, sentarse y acostarse  No permanezca sentada o de pie en el  mismo lugar durante largos perodos.  Cuando est sentada, adopte una Patent examiner. Asegrese de que su cabeza descansa sobre sus hombros y no est colgando hacia delante. Use una almohada en la parte inferior de la espalda si es necesario.  Trate de dormir de lado, de preferencia el lado izquierdo, con una o [employer] piernas. Si est dolorida despus de una noche de descanso, la cama puede ser [employer]. Un colchn duro puede brindarle ms apoyo para la Doctor, general practice. Instrucciones generales  No use zapatos con tacones altos.  Consumir una dieta saludable. Trate de aumentar de peso dentro de las recomendaciones del mdico.  Use una faja de maternidad, un arns elstico o un cors para la espalda como se lo haya indicado el mdico.  Tome los medicamentos de venta libre y los recetados solamente como se lo haya indicado el mdico.  Consulting civil engineer a todas las visitas de control como se lo haya indicado el mdico. Esto es importante. [place] visitas a los especialistas, como un fisioterapeuta. SOLICITE ATENCIN MDICA SI:  El dolor de espalda le impide realizar las actividades cotidianas.  Aumenta el dolor en otras partes del cuerpo. SOLICITE ATENCIN [place] DE INMEDIATO SI:  Siente entumecimiento, hormigueo, debilidad o problemas con el uso de los brazos o las piernas.  Siente un dolor de espalda intenso que no puede controlar con los medicamentos.  Tiene modificaciones repentinas en el control de la vejiga o el intestino.  Siente que le falta el aire, se marea o se desmaya.  Tiene nuseas, vmitos o sudoracin.  Siente un dolor de espalda que es rtmico y de tipo clico, similar a las contracciones del [place]. Las contracciones del parto suelen aparecer cada 1 a 7minutos, duran aproximadamente 29minuto y estn acompaadas de una sensacin de empujar o de presin en la pelvis.  Tiene dolor de espalda y rompe la bolsa de las aguas o tiene sangrado  vaginal.  El dolor o el adormecimiento se extienden hacia la pierna.  El dolor aparece despus de una cada.  Siente dolor de un solo lado.  Observa sangre en la orina.  Le aparecen ampollas en la piel en la zona del dolor de espalda. Esta informacin no tiene Marine scientist el consejo del mdico. Asegrese de hacerle al mdico cualquier pregunta que tenga. Document Released: 12/08/2010 Document Revised: 07/20/2015 Document Reviewed: 12/10/2014 Elsevier Interactive Patient Education  [employer].

## 2016-11-20 NOTE — MAU Note (Signed)
Patient presents to MAU with c/o pressure in back and increase in urine frequency. Symptoms started last night with urge to urinate but nothing came out. +FM. Denies VB and LOF.

## 2016-11-21 ENCOUNTER — Ambulatory Visit: Payer: Self-pay | Admitting: *Deleted

## 2016-11-21 DIAGNOSIS — Z7189 Other specified counseling: Secondary | ICD-10-CM

## 2016-11-21 NOTE — Progress Notes (Signed)
Patient presents to clinic for check of her glucometer, she feels like the readings are off. Yesterday evening had bg=165, rechecked and was ten points less on other arm. Upon review of stored glucose values this RN noted that patient is taking her sugars multiple times because she isn't trusting the readings. Reviewed with patient correct technique for checking blood sugars, also reviewed her diet relative to the readings the last 2 days.  Recommended patient check her sugar only once rather than several times when she doesn't like the value. Reassured her that home glucometers may be somewhat inaccurate, but do help give a good idea of what her sugars are running. Keep a log of her sugars and possibly also a food diary so that her md/diabetes educator and see the numbers relative to what she took in. Patient voiced understanding. She has an appointment on 8/15 with Julianne Handler, CNM.

## 2016-11-23 ENCOUNTER — Ambulatory Visit (INDEPENDENT_AMBULATORY_CARE_PROVIDER_SITE_OTHER): Payer: Self-pay | Admitting: *Deleted

## 2016-11-23 ENCOUNTER — Other Ambulatory Visit: Payer: Self-pay

## 2016-11-23 ENCOUNTER — Ambulatory Visit: Payer: Self-pay

## 2016-11-23 ENCOUNTER — Ambulatory Visit (INDEPENDENT_AMBULATORY_CARE_PROVIDER_SITE_OTHER): Payer: Self-pay | Admitting: Certified Nurse Midwife

## 2016-11-23 ENCOUNTER — Encounter: Payer: Self-pay | Admitting: Obstetrics and Gynecology

## 2016-11-23 VITALS — BP 100/63 | HR 98 | Wt 141.8 lb

## 2016-11-23 DIAGNOSIS — O24419 Gestational diabetes mellitus in pregnancy, unspecified control: Secondary | ICD-10-CM

## 2016-11-23 DIAGNOSIS — O0993 Supervision of high risk pregnancy, unspecified, third trimester: Secondary | ICD-10-CM

## 2016-11-23 DIAGNOSIS — O0990 Supervision of high risk pregnancy, unspecified, unspecified trimester: Secondary | ICD-10-CM

## 2016-11-23 DIAGNOSIS — O09899 Supervision of other high risk pregnancies, unspecified trimester: Secondary | ICD-10-CM

## 2016-11-23 DIAGNOSIS — O09213 Supervision of pregnancy with history of pre-term labor, third trimester: Secondary | ICD-10-CM

## 2016-11-23 DIAGNOSIS — O09523 Supervision of elderly multigravida, third trimester: Secondary | ICD-10-CM

## 2016-11-23 DIAGNOSIS — O09219 Supervision of pregnancy with history of pre-term labor, unspecified trimester: Secondary | ICD-10-CM

## 2016-11-23 NOTE — Progress Notes (Signed)
8/15 NST reviewed and reactive

## 2016-11-23 NOTE — Progress Notes (Signed)

## 2016-11-23 NOTE — Progress Notes (Signed)
Subjective:  Tricia Williams is a 41 y.o. [phone] at [redacted]w[redacted]d being seen today for ongoing prenatal care.  She is currently monitored for the following issues for this high-risk pregnancy and has Environmental allergies; Advanced maternal age in multigravida; Supervision of high risk pregnancy, antepartum; History of preterm delivery, currently pregnant; GDM, class A2; Language barrier; Sebaceous cyst of breast, left; and Bradycardia on her problem list.  Patient reports no complaints.  Contractions: Not present. Vag. Bleeding: None.  Movement: Present. Denies leaking of fluid.   The following portions of the patient's history were reviewed and updated as appropriate: allergies, current medications, past family history, past medical history, past social history, past surgical history and problem list. Problem list updated.  Objective:   Vitals:   11/23/16 1003  BP: 100/63  Pulse: 98  Weight: 141 lb 12.8 oz (64.3 kg)    Fetal Status: Fetal Heart Rate (bpm): NST Fundal Height: 32 cm Movement: Present     General:  Alert, oriented and cooperative. Patient is in no acute distress.  Skin: Skin is warm and dry. No rash noted.   Cardiovascular: Normal heart rate noted  Respiratory: Normal respiratory effort, no problems with respiration noted  Abdomen: Soft, gravid, appropriate for gestational age. Pain/Pressure: Present     Pelvic: Vag. Bleeding: None     Cervical exam deferred        Extremities: Normal range of motion.  Edema: None  Mental Status: Normal mood and affect. Normal behavior. Normal judgment and thought content.   Urinalysis:      Assessment and Plan:  Pregnancy: [id number] at [redacted]w[redacted]d  1. GDM, class A2 - Pt is adamant that her meter is giving inaccurate readings - will arrange for her to return tomorrow and meet with diabetes educator to verify if new meter needed - FBS 88-109, pp 89-198 - reactive NST, BPP 8/8, nml AFI > 15.7  2. Supervision of high risk pregnancy,  antepartum - continue weekly AT  3. History of preterm delivery, currently pregnant - continue 17-P  4. Elderly multigravida in third trimester   Preterm labor symptoms and general obstetric precautions including but not limited to vaginal bleeding, contractions, leaking of fluid and fetal movement were reviewed in detail with the patient. Please refer to After Visit Summary for other counseling recommendations.  Return in about 7 days (around 11/30/2016) for as scheduled; 8/29 Please change appt to 8/30 @ 0940 w/provider and 10/40 for NST only - pt has [place] @.   Julianne Handler, CNM

## 2016-11-24 ENCOUNTER — Encounter: Payer: Self-pay | Admitting: *Deleted

## 2016-11-24 ENCOUNTER — Ambulatory Visit: Payer: Self-pay | Admitting: *Deleted

## 2016-11-24 DIAGNOSIS — R7309 Other abnormal glucose: Secondary | ICD-10-CM

## 2016-11-24 NOTE — Progress Notes (Signed)
  Patient was seen on 11/24/2016 for Gestational Diabetes self-management follow up visit .EDD: 01/07/2017. She speaks Spanish, we had an interpretor for this visit. She brought her BG Log Sheet today and it shows improved FBG below 95 since last week when she was encouraged to add protein to her evening snack. She expressed concern over accuracy of her meter when last Sunday her post lunch BG was 160 and when she re-checked after washing her hands it was 140 and she now doubts the accuracy of the meter. She also states that one day for breakfast she had run out of peanut butter so she had cereal and milk and her post breakfast BG went to 200 mg/dl. She states her husband encourages her to walk but her back is starting to hurt with walking, so she asked what else she could do?  Intervention:  I reviewed guidelines that anytime BG is above target range, wash hands and recheck  She confirmed that she is changing the calibration chip with every new bottle of strips  I explained that these meters can vary by at least 10% accuracy which is considered acceptable by the FDA  I offered her a new meter, which she accepted. I instructed her to go ahead with the new meter but not to check more than one time unless BG is elevated and then to re-check after washing her hands only to trouble shoot her technique.  I explained the high carbohydrate content of cereal and suggested she choose her bread with any protein source including peanut butter, cheese, eggs  I reviewed the factors that have an effect on BG readings: food and stress can raise BG and activity and diabetes medications will lower BG. Any variability with any of these will have an impact on BG results  I explained arm chair exercises to her and suggested she include after meals as tolerated, especially if post meal BG's are climbing.  Plan:  Aim for 3 Carb Choices per meal (45 grams) +/- 1 either way  Aim for 1-2 Carbs per snack Continue reading  food labels for Total Carbohydrate of foods Continue with your activity level by walking or arm chair exercises daily as tolerated Continue checking BG before breakfast and 2 hours after first bite of breakfast, lunch and dinner as directed by MD  Take medication, Glyburide 2.5 mg before breakfast as directed by MD  Patient instructed to monitor glucose levels: FBS: 60 - <95 2 hour: <120  Patient received the following  True Track meter  Lot #; [id number] Expiration Date: 07/30/2018:  Patient will be seen for follow-up as needed.

## 2016-11-30 ENCOUNTER — Ambulatory Visit (INDEPENDENT_AMBULATORY_CARE_PROVIDER_SITE_OTHER): Payer: Self-pay | Admitting: *Deleted

## 2016-11-30 ENCOUNTER — Ambulatory Visit: Payer: Self-pay

## 2016-11-30 ENCOUNTER — Ambulatory Visit (INDEPENDENT_AMBULATORY_CARE_PROVIDER_SITE_OTHER): Payer: Self-pay | Admitting: Obstetrics & Gynecology

## 2016-11-30 VITALS — BP 104/59 | HR 69 | Wt 145.0 lb

## 2016-11-30 DIAGNOSIS — O09213 Supervision of pregnancy with history of pre-term labor, third trimester: Secondary | ICD-10-CM

## 2016-11-30 DIAGNOSIS — O09219 Supervision of pregnancy with history of pre-term labor, unspecified trimester: Secondary | ICD-10-CM

## 2016-11-30 DIAGNOSIS — O09899 Supervision of other high risk pregnancies, unspecified trimester: Secondary | ICD-10-CM

## 2016-11-30 DIAGNOSIS — O24419 Gestational diabetes mellitus in pregnancy, unspecified control: Secondary | ICD-10-CM

## 2016-11-30 DIAGNOSIS — O0990 Supervision of high risk pregnancy, unspecified, unspecified trimester: Secondary | ICD-10-CM

## 2016-11-30 DIAGNOSIS — O0993 Supervision of high risk pregnancy, unspecified, third trimester: Secondary | ICD-10-CM

## 2016-11-30 NOTE — Progress Notes (Signed)
[place] for growth scheduled 8/30, BPP added

## 2016-11-30 NOTE — Progress Notes (Signed)

## 2016-11-30 NOTE — Progress Notes (Signed)
   PRENATAL VISIT NOTE  Subjective:  Tricia Williams is a 41 y.o. [phone] at [redacted]w[redacted]d being seen today for ongoing prenatal care.  She is currently monitored for the following issues for this high-risk pregnancy and has Environmental allergies; Advanced maternal age in multigravida; Supervision of high risk pregnancy, antepartum; History of preterm delivery, currently pregnant; GDM, class A2; Language barrier; Sebaceous cyst of breast, left; and Bradycardia on her problem list.  Patient reports no complaints.  Contractions: Irregular. Vag. Bleeding: None.  Movement: Present. Denies leaking of fluid.   The following portions of the patient's history were reviewed and updated as appropriate: allergies, current medications, past family history, past medical history, past social history, past surgical history and problem list. Problem list updated.  Objective:   Vitals:   11/30/16 0932  BP: (!) 104/59  Pulse: 69  Weight: 145 lb (65.8 kg)    Fetal Status: Fetal Heart Rate (bpm): NST   Movement: Present     General:  Alert, oriented and cooperative. Patient is in no acute distress.  Skin: Skin is warm and dry. No rash noted.   Cardiovascular: Normal heart rate noted  Respiratory: Normal respiratory effort, no problems with respiration noted  Abdomen: Soft, gravid, appropriate for gestational age.  Pain/Pressure: Present     Pelvic: Cervical exam deferred        Extremities: Normal range of motion.  Edema: None  Mental Status:  Normal mood and affect. Normal behavior. Normal judgment and thought content.   Assessment and Plan:  Pregnancy: [id number] at [redacted]w[redacted]d  1. GDM, class A2 NST BPP nl - [place] MFM FETAL BPP WO NON STRESS; Future  2. Supervision of high risk pregnancy, antepartum   3. History of preterm delivery, currently pregnant FBS wnl, PP most<140  Preterm labor symptoms and general obstetric precautions including but not limited to vaginal bleeding, contractions, leaking of  fluid and fetal movement were reviewed in detail with the patient. Please refer to After Visit Summary for other counseling recommendations.  Return in about 1 week (around 12/07/2016) for NST BPP.   Emeterio Reeve, MD

## 2016-11-30 NOTE — Patient Instructions (Signed)
Tercer trimestre de embarazo (Third Trimester of Pregnancy) El tercer trimestre comprende desde la semana29 hasta la semana42, es decir, desde el mes7 hasta el mes9. El tercer trimestre es un perodo en el que el feto crece rpidamente. Hacia el final del noveno mes, el feto mide alrededor de 20pulgadas (45cm) de largo y pesa entre 6 y 10 libras (2,700 y 4,500kg). CAMBIOS EN EL ORGANISMO Su organismo atraviesa por muchos cambios durante el embarazo, y estos varan de una mujer a otra.  Seguir aumentando de peso. Es de esperar que aumente entre 25 y 35libras (11 y 16kg) hacia el final del embarazo.  Podrn aparecer las primeras estras en las caderas, el abdomen y las mamas.  Puede tener necesidad de orinar con ms frecuencia porque el feto baja hacia la pelvis y ejerce presin sobre la vejiga.  Debido al embarazo podr sentir acidez estomacal con frecuencia.  Puede estar estreida, ya que ciertas hormonas enlentecen los movimientos de los msculos que empujan los desechos a travs de los intestinos.  Pueden aparecer hemorroides o abultarse e hincharse las venas (venas varicosas).  Puede sentir dolor plvico debido al aumento de peso y a que las hormonas del embarazo relajan las articulaciones entre los huesos de la pelvis. El dolor de espalda puede ser consecuencia de la sobrecarga de los msculos que soportan la postura.  Tal vez haya cambios en el cabello que pueden incluir su engrosamiento, crecimiento rpido y cambios en la textura. Adems, a algunas mujeres se les cae el cabello durante o despus del embarazo, o tienen el cabello seco o fino. Lo ms probable es que el cabello se le normalice despus del nacimiento del beb.  Las mamas seguirn creciendo y le dolern. A veces, puede haber una secrecin amarilla de las mamas llamada calostro.  El ombligo puede salir hacia afuera.  Puede sentir que le falta el aire debido a que se expande el tero.  Puede notar que el feto  "baja" o lo siente ms bajo, en el abdomen.  Puede tener una prdida de secrecin mucosa con sangre. Esto suele ocurrir en el trmino de unos pocos das a una semana antes de que comience el trabajo de parto.  El cuello del tero se vuelve delgado y blando (se borra) cerca de la fecha de parto. QU DEBE ESPERAR EN LOS EXMENES PRENATALES Le harn exmenes prenatales cada 2semanas hasta la semana36. A partir de ese momento le harn exmenes semanales. Durante una visita prenatal de rutina:  La pesarn para asegurarse de que usted y el feto estn creciendo normalmente.  Le tomarn la presin arterial.  Le medirn el abdomen para controlar el desarrollo del beb.  Se escucharn los latidos cardacos fetales.  Se evaluarn los resultados de los estudios solicitados en visitas anteriores.  Le revisarn el cuello del tero cuando est prxima la fecha de parto para controlar si este se ha borrado. Alrededor de la semana36, el mdico le revisar el cuello del tero. Al mismo tiempo, realizar un anlisis de las secreciones del tejido vaginal. Este examen es para determinar si hay un tipo de bacteria, estreptococo Grupo B. El mdico le explicar esto con ms detalle. El mdico puede preguntarle lo siguiente:  Cmo le gustara que fuera el parto.  Cmo se siente.  Si siente los movimientos del beb.  Si ha tenido sntomas anormales, como prdida de lquido, sangrado, dolores de cabeza intensos o clicos abdominales.  Si est consumiendo algn producto que contenga tabaco, como cigarrillos, tabaco de mascar y   cigarrillos electrnicos.  Si tiene alguna pregunta. Otros exmenes o estudios de deteccin que pueden realizarse durante el tercer trimestre incluyen lo siguiente:  Anlisis de sangre para controlar los niveles de hierro (anemia).  Controles fetales para determinar su salud, nivel de actividad y crecimiento. Si tiene alguna enfermedad o hay problemas durante el embarazo, le harn  estudios.  Prueba del VIH (virus de inmunodeficiencia humana). Si corre un riesgo alto, pueden realizarle una prueba de deteccin del VIH durante el tercer trimestre del embarazo. FALSO TRABAJO DE PARTO Es posible que sienta contracciones leves e irregulares que finalmente desaparecen. Se llaman contracciones de Braxton Hicks o falso trabajo de parto. Las contracciones pueden durar horas, das o incluso semanas, antes de que el verdadero trabajo de parto se inicie. Si las contracciones ocurren a intervalos regulares, se intensifican o se hacen dolorosas, lo mejor es que la revise el mdico. SIGNOS DE TRABAJO DE PARTO  Clicos de tipo menstrual.  Contracciones cada 5minutos o menos.  Contracciones que comienzan en la parte superior del tero y se extienden hacia abajo, a la zona inferior del abdomen y la espalda.  Sensacin de mayor presin en la pelvis o dolor de espalda.  Una secrecin de mucosidad acuosa o con sangre que sale de la vagina. Si tiene alguno de estos signos antes de la semana37 del embarazo, llame a su mdico de inmediato. Debe concurrir al hospital para que la controlen inmediatamente. INSTRUCCIONES PARA EL CUIDADO EN EL HOGAR  Evite fumar, consumir hierbas, beber alcohol y tomar frmacos que no le hayan recetado. Estas sustancias qumicas afectan la formacin y el desarrollo del beb.  No consuma ningn producto que contenga tabaco, lo que incluye cigarrillos, tabaco de mascar y cigarrillos electrnicos. Si necesita ayuda para dejar de fumar, consulte al mdico. Puede recibir asesoramiento y otro tipo de recursos para dejar de fumar.  Siga las indicaciones del mdico en relacin con el uso de medicamentos. Durante el embarazo, hay medicamentos que son seguros de tomar y otros que no.  Haga ejercicio solamente como se lo haya indicado el mdico. Sentir clicos uterinos es un buen signo para detener la actividad fsica.  Contine comiendo alimentos sanos con  regularidad.  Use un sostn que le brinde buen soporte si le duelen las mamas.  No se d baos de inmersin en agua caliente, baos turcos ni saunas.  Use el cinturn de seguridad en todo momento mientras conduce.  No coma carne cruda ni queso sin cocinar; evite el contacto con las bandejas sanitarias de los gatos y la tierra que estos animales usan. Estos elementos contienen grmenes que pueden causar defectos congnitos en el beb.  Tome las vitaminas prenatales.  Tome entre 1500 y 2000mg de calcio diariamente comenzando en la semana20 del embarazo hasta el parto.  Si est estreida, pruebe un laxante suave (si el mdico lo autoriza). Consuma ms alimentos ricos en fibra, como vegetales y frutas frescos y cereales integrales. Beba gran cantidad de lquido para mantener la orina de tono claro o color amarillo plido.  Dese baos de asiento con agua tibia para aliviar el dolor o las molestias causadas por las hemorroides. Use una crema para las hemorroides si el mdico la autoriza.  Si tiene venas varicosas, use medias de descanso. Eleve los pies durante 15minutos, 3 o 4veces por da. Limite el consumo de sal en su dieta.  Evite levantar objetos pesados, use zapatos de tacones bajos y mantenga una buena postura.  Descanse con las piernas elevadas si tiene   calambres o dolor de cintura.  Visite a su dentista si no lo ha hecho durante el embarazo. Use un cepillo de dientes blando para higienizarse los dientes y psese el hilo dental con suavidad.  Puede seguir manteniendo relaciones sexuales, a menos que el mdico le indique lo contrario.  No haga viajes largos excepto que sea absolutamente necesario y solo con la autorizacin del mdico.  Tome clases prenatales para entender, practicar y hacer preguntas sobre el trabajo de parto y el parto.  Haga un ensayo de la partida al hospital.  Prepare el bolso que llevar al hospital.  Prepare la habitacin del beb.  Concurra a todas  las visitas prenatales segn las indicaciones de su mdico.  SOLICITE ATENCIN MDICA SI:  No est segura de que est en trabajo de parto o de que ha roto la bolsa de las aguas.  Tiene mareos.  Siente clicos leves, presin en la pelvis o dolor persistente en el abdomen.  Tiene nuseas, vmitos o diarrea persistentes.  Observa una secrecin vaginal con mal olor.  Siente dolor al orinar.  SOLICITE ATENCIN MDICA DE INMEDIATO SI:  Tiene fiebre.  Tiene una prdida de lquido por la vagina.  Tiene sangrado o pequeas prdidas vaginales.  Siente dolor intenso o clicos en el abdomen.  Sube o baja de peso rpidamente.  Tiene dificultad para respirar y siente dolor de pecho.  Sbitamente se le hinchan mucho el rostro, las manos, los tobillos, los pies o las piernas.  No ha sentido los movimientos del beb durante una hora.  Siente un dolor de cabeza intenso que no se alivia con medicamentos.  Su visin se modifica.  Esta informacin no tiene como fin reemplazar el consejo del mdico. Asegrese de hacerle al mdico cualquier pregunta que tenga. Document Released: 01/05/2005 Document Revised: 04/18/2014 Document Reviewed: 05/29/2012 Elsevier Interactive Patient Education  2017 [employer]  

## 2016-12-02 ENCOUNTER — Encounter: Payer: Self-pay | Admitting: Obstetrics and Gynecology

## 2016-12-05 NOTE — Progress Notes (Signed)
Late entry due to [facility] not available during pt's appt on 8/8. Interpreter [employer] present for encounter. NST completed. Pt will return tomorrow for BPP and visit with Diabetes educator for purpose of review of CBG values and additional diet instruction.

## 2016-12-05 NOTE — Addendum Note (Signed)
Addended by: Lu Duffel L on: 12/05/2016 12:00 PM   Modules accepted: Orders

## 2016-12-07 ENCOUNTER — Other Ambulatory Visit: Payer: Self-pay

## 2016-12-07 ENCOUNTER — Encounter: Payer: Self-pay | Admitting: Obstetrics & Gynecology

## 2016-12-08 ENCOUNTER — Ambulatory Visit (INDEPENDENT_AMBULATORY_CARE_PROVIDER_SITE_OTHER): Payer: Self-pay | Admitting: Obstetrics & Gynecology

## 2016-12-08 ENCOUNTER — Ambulatory Visit (HOSPITAL_COMMUNITY)
Admission: RE | Admit: 2016-12-08 | Discharge: 2016-12-08 | Disposition: A | Discharge status: Home / Self Care | Payer: Self-pay | Source: Ambulatory Visit | Attending: Obstetrics & Gynecology | Admitting: Obstetrics & Gynecology

## 2016-12-08 ENCOUNTER — Other Ambulatory Visit: Payer: Self-pay | Admitting: Obstetrics & Gynecology

## 2016-12-08 ENCOUNTER — Other Ambulatory Visit (HOSPITAL_COMMUNITY): Payer: Self-pay | Admitting: Maternal & Fetal Medicine

## 2016-12-08 ENCOUNTER — Encounter (HOSPITAL_COMMUNITY): Payer: Self-pay

## 2016-12-08 ENCOUNTER — Other Ambulatory Visit (HOSPITAL_COMMUNITY): Payer: Self-pay | Admitting: *Deleted

## 2016-12-08 ENCOUNTER — Ambulatory Visit (INDEPENDENT_AMBULATORY_CARE_PROVIDER_SITE_OTHER): Payer: Self-pay | Admitting: General Practice

## 2016-12-08 VITALS — BP 101/60 | HR 63 | Wt 145.7 lb

## 2016-12-08 DIAGNOSIS — O24419 Gestational diabetes mellitus in pregnancy, unspecified control: Secondary | ICD-10-CM

## 2016-12-08 DIAGNOSIS — O0993 Supervision of high risk pregnancy, unspecified, third trimester: Secondary | ICD-10-CM

## 2016-12-08 DIAGNOSIS — O09523 Supervision of elderly multigravida, third trimester: Secondary | ICD-10-CM

## 2016-12-08 DIAGNOSIS — Z331 Pregnant state, incidental: Secondary | ICD-10-CM

## 2016-12-08 DIAGNOSIS — O0990 Supervision of high risk pregnancy, unspecified, unspecified trimester: Secondary | ICD-10-CM

## 2016-12-08 DIAGNOSIS — O24415 Gestational diabetes mellitus in pregnancy, controlled by oral hypoglycemic drugs: Secondary | ICD-10-CM

## 2016-12-08 DIAGNOSIS — Z3A35 35 weeks gestation of pregnancy: Secondary | ICD-10-CM

## 2016-12-08 DIAGNOSIS — O09219 Supervision of pregnancy with history of pre-term labor, unspecified trimester: Secondary | ICD-10-CM

## 2016-12-08 DIAGNOSIS — O09899 Supervision of other high risk pregnancies, unspecified trimester: Secondary | ICD-10-CM

## 2016-12-08 DIAGNOSIS — Z113 Encounter for screening for infections with a predominantly sexual mode of transmission: Secondary | ICD-10-CM

## 2016-12-08 DIAGNOSIS — O09213 Supervision of pregnancy with history of pre-term labor, third trimester: Secondary | ICD-10-CM

## 2016-12-08 LAB — POCT URINALYSIS DIP (DEVICE)
Bilirubin Urine: NEGATIVE
Glucose, UA: NEGATIVE mg/dL
HGB URINE DIPSTICK: NEGATIVE
KETONES UR: NEGATIVE mg/dL
LEUKOCYTES UA: NEGATIVE
Nitrite: NEGATIVE
Protein, ur: NEGATIVE mg/dL
SPECIFIC GRAVITY, URINE: 1.02 (ref 1.005–1.030)
UROBILINOGEN UA: 0.2 mg/dL (ref 0.0–1.0)
pH: 6 (ref 5.0–8.0)

## 2016-12-08 NOTE — Progress Notes (Signed)
PRENATAL VISIT NOTE  Subjective:  Tricia Williams is a 41 y.o. [phone] at [redacted]w[redacted]d being seen today for ongoing prenatal care. Patient is Spanish-speaking only, Spanish interpreter present for this encounter. She is currently monitored for the following issues for this high-risk pregnancy and has Environmental allergies; Advanced maternal age in multigravida; Supervision of high risk pregnancy, antepartum; History of preterm delivery, currently pregnant; GDM, class A2; Language barrier; Sebaceous cyst of breast, left; and Bradycardia on her problem list.  Patient reports no complaints.  Contractions: Irregular. Vag. Bleeding: None.  Movement: Present. Denies leaking of fluid.   The following portions of the patient's history were reviewed and updated as appropriate: allergies, current medications, past family history, past medical history, past social history, past surgical history and problem list. Problem list updated.  Objective:   Vitals:   12/08/16 0943  BP: 101/60  Pulse: 63  Weight: 145 lb 11.2 oz (66.1 kg)    Fetal Status: Fetal Heart Rate (bpm): NST   Movement: Present  Presentation: Vertex  General:  Alert, oriented and cooperative. Patient is in no acute distress.  Skin: Skin is warm and dry. No rash noted.   Cardiovascular: Normal heart rate noted  Respiratory: Normal respiratory effort, no problems with respiration noted  Abdomen: Soft, gravid, appropriate for gestational age.  Pain/Pressure: Present     Pelvic: Cervical exam performed Dilation: 1 Effacement (%): Thick Station: Ballotable  Extremities: Normal range of motion.  Edema: None  Mental Status:  Normal mood and affect. Normal behavior. Normal judgment and thought content.   .[place] Mfm Fetal Bpp Wo Non Stress  Result Date: 12/08/2016 ----------------------------------------------------------------------  OBSTETRICS REPORT                      (Signed Final 12/08/2016 08:53 am)  ---------------------------------------------------------------------- Patient Info  ID #:       [id number]                          D.O.B.:  [birth year] (40 yrs)  Name:       Tricia Williams-                Visit Date: 12/08/2016 08:36 am              [facility] ---------------------------------------------------------------------- Performed By  Performed By:     Berlinda Last          Ref. Address:     [facility]                                                             [address]  [place], [place]  Attending:        Griffin Dakin MD         Location:         [place]  Referred By:      [facility] for                    [place] ---------------------------------------------------------------------- Orders   #  Description                                 Code   1  [place] MFM OB FOLLOW UP                         [phone]   2  [place] MFM FETAL BPP WO NON STRESS              37169.67  ----------------------------------------------------------------------   #  Ordered By               Order #        Accession #    Episode #   1  Abram Sander              [id number]      [id number]     [id number]   2  Williams ARNOLD             [id number]      [id number]     [id number]  ---------------------------------------------------------------------- Indications   [redacted] weeks gestation of pregnancy                Z3A.35   Gestational diabetes in pregnancy,             O24.419   unspecified control  ---------------------------------------------------------------------- OB History  Blood Type:            Height:  5'1"   Weight (lb):  142       BMI:  26.83  Gravidity:    4         Term:   1  Living:        3 ---------------------------------------------------------------------- Fetal Evaluation  Num Of Fetuses:     1  Fetal Heart         149  Rate(bpm):  Cardiac Activity:   Observed  Presentation:       Cephalic  Placenta:           Anterior, above cervical os  P. Cord Insertion:  Previously  Visualized  Amniotic Fluid  AFI FV:      Subjectively within normal limits  AFI Sum(cm)     %Tile       Largest Pocket(cm)  13.75           49          4.92  RUQ(cm)       RLQ(cm)       LUQ(cm)        LLQ(cm)  3.54          2.8           2.49           4.92 ---------------------------------------------------------------------- Biophysical Evaluation  Amniotic F.V:   Within normal limits       F. Tone:        Observed  F. Movement:    Observed                   Score:          8/8  F. Breathing:   Observed ---------------------------------------------------------------------- Biometry  BPD:        90  mm     G. Age:  36w 4d         76  %    CI:        80.56   %    70 - 86                                                          FL/HC:      20.5   %    20.1 - 22.1  HC:      316.7  mm     G. Age:  35w 4d         18  %    HC/AC:      0.92        0.93 - 1.11  AC:      345.4  mm     G. Age:  38w 3d       > 97  %    FL/BPD:     72.1   %    71 - 87  FL:       64.9  mm     G. Age:  33w 3d          5  %    FL/AC:      18.8   %    20 - 24  Est. FW:    3023  gm    6 lb 11 oz      81  % ---------------------------------------------------------------------- Gestational Age  LMP:           35w 5d        Date:  04/02/16                 EDD:   01/07/17  U/S Today:     36w 0d                                        EDD:   01/05/17  Best:          35w 5d     Det. By:  LMP  (04/02/16)          EDD:   01/07/17 ---------------------------------------------------------------------- Anatomy  Cranium:               Appears normal         Aortic Arch:            Appears normal  Cavum:                 Previously seen        Ductal Arch:             Appears normal  Ventricles:            Previously seen        Diaphragm:              Previously seen  Choroid Plexus:        Previously seen        Stomach:                Appears normal, left                                                                        sided  Cerebellum:            Previously seen        Abdomen:                Appears normal  Posterior Fossa:       Previously seen        Abdominal Wall:         Previously seen  Nuchal Fold:           Previously seen        Cord Vessels:           Previously seen  Face:                  Orbits and profile     Kidneys:                Appear normal                         previously seen  Lips:                  Previously seen        Bladder:                Appears normal  Thoracic:              Appears normal         Spine:                  Previously seen  Heart:                 Previously seen        Upper Extremities:      Previously seen  RVOT:                  Previously seen        Lower Extremities:      Previously seen  LVOT:  Previously seen  Other:  Female gender. Heels and 5th digit previously visualized. ---------------------------------------------------------------------- Cervix Uterus Adnexa  Cervix  Not visualized (advanced GA >29wks) ---------------------------------------------------------------------- Impression  SIUP at 35+5 weeks with GDM and accelerated fetal growth  Normal interval anatomy; anatomic survey complete  Normal amniotic fluid volume  Appropriate interval growth with EFW at the 81st percentile;  AC remains >97th percentile  BPP 8/8 ---------------------------------------------------------------------- Recommendations  Recheck rate of fetal growth in 3 weeks if undelivered ----------------------------------------------------------------------                 Griffin Dakin, MD Electronically Signed Final Report   12/08/2016 08:53 am ----------------------------------------------------------------------  [place] Mfm  Ob Follow Up  Result Date: 12/08/2016 ----------------------------------------------------------------------  OBSTETRICS REPORT                      (Signed Final 12/08/2016 08:53 am) ---------------------------------------------------------------------- Patient Info  ID #:       [id number]                          D.O.B.:  Mar 08, 1976 (40 yrs)  Name:       Tricia Williams-                Visit Date: 12/08/2016 08:36 am              [facility] ---------------------------------------------------------------------- Performed By  Performed By:     Berlinda Last          Ref. Address:     [facility]                                                             [address]                                                             [place], [place]  Attending:        Griffin Dakin MD         Location:         [facility]  Referred By:      [facility] for                    [place] ---------------------------------------------------------------------- Orders   #  Description                                 Code   1  [place] MFM OB FOLLOW UP                         [phone]   2  [place] MFM FETAL BPP WO NON STRESS              25852.77  ----------------------------------------------------------------------   #  Ordered By               Order #        Accession #    Episode #   1  Abram Sander              [id number]      [id number]     [id number]   2  Williams ARNOLD             [id number]      [id number]     [id number]  ---------------------------------------------------------------------- Indications   [redacted] weeks gestation of pregnancy                Z3A.35   Gestational diabetes in pregnancy,             O24.419   unspecified control   ---------------------------------------------------------------------- OB History  Blood Type:            Height:  5'1"   Weight (lb):  142       BMI:  26.83  Gravidity:    4         Term:   1  Living:       3 ---------------------------------------------------------------------- Fetal Evaluation  Num Of Fetuses:     1  Fetal Heart         149  Rate(bpm):  Cardiac Activity:   Observed  Presentation:       Cephalic  Placenta:           Anterior, above cervical os  P. Cord Insertion:  Previously Visualized  Amniotic Fluid  AFI FV:      Subjectively within normal limits  AFI Sum(cm)     %Tile       Largest Pocket(cm)  13.75           49          4.92  RUQ(cm)       RLQ(cm)       LUQ(cm)        LLQ(cm)  3.54          2.8           2.49           4.92 ---------------------------------------------------------------------- Biophysical Evaluation  Amniotic F.V:   Within normal limits  F. Tone:        Observed  F. Movement:    Observed                   Score:          8/8  F. Breathing:   Observed ---------------------------------------------------------------------- Biometry  BPD:        90  mm     G. Age:  36w 4d         76  %    CI:        80.56   %    70 - 86                                                          FL/HC:      20.5   %    20.1 - 22.1  HC:      316.7  mm     G. Age:  35w 4d         18  %    HC/AC:      0.92        0.93 - 1.11  AC:      345.4  mm     G. Age:  38w 3d       > 97  %    FL/BPD:     72.1   %    71 - 87  FL:       64.9  mm     G. Age:  33w 3d          5  %    FL/AC:      18.8   %    20 - 24  Est. FW:    3023  gm    6 lb 11 oz      81  % ---------------------------------------------------------------------- Gestational Age  LMP:           35w 5d        Date:  04/02/16                 EDD:   01/07/17  U/S Today:     36w 0d                                        EDD:   01/05/17  Best:          35w 5d     Det. By:  LMP  (04/02/16)          EDD:   01/07/17  ---------------------------------------------------------------------- Anatomy  Cranium:               Appears normal         Aortic Arch:            Appears normal  Cavum:                 Previously seen        Ductal Arch:            Appears normal  Ventricles:            Previously seen        Diaphragm:  Previously seen  Choroid Plexus:        Previously seen        Stomach:                Appears normal, left                                                                        sided  Cerebellum:            Previously seen        Abdomen:                Appears normal  Posterior Fossa:       Previously seen        Abdominal Wall:         Previously seen  Nuchal Fold:           Previously seen        Cord Vessels:           Previously seen  Face:                  Orbits and profile     Kidneys:                Appear normal                         previously seen  Lips:                  Previously seen        Bladder:                Appears normal  Thoracic:              Appears normal         Spine:                  Previously seen  Heart:                 Previously seen        Upper Extremities:      Previously seen  RVOT:                  Previously seen        Lower Extremities:      Previously seen  LVOT:                  Previously seen  Other:  Female gender. Heels and 5th digit previously visualized. ---------------------------------------------------------------------- Cervix Uterus Adnexa  Cervix  Not visualized (advanced GA >29wks) ---------------------------------------------------------------------- Impression  SIUP at 35+5 weeks with GDM and accelerated fetal growth  Normal interval anatomy; anatomic survey complete  Normal amniotic fluid volume  Appropriate interval growth with EFW at the 81st percentile;  AC remains >97th percentile  BPP 8/8 ---------------------------------------------------------------------- Recommendations  Recheck rate of fetal growth in 3 weeks if undelivered  ----------------------------------------------------------------------                 Griffin Dakin, MD Electronically Signed Final Report   12/08/2016 08:53 am ----------------------------------------------------------------------  [place] Mfm Ob Follow Up  Result Date: 11/10/2016 ----------------------------------------------------------------------  OBSTETRICS REPORT                      (  Signed Final 11/10/2016 10:38 am) ---------------------------------------------------------------------- Patient Info  ID #:       [id number]                          D.O.B.:  [birth year] (40 yrs)  Name:       Tricia Williams-                Visit Date: 11/10/2016 10:16 am              [facility] ---------------------------------------------------------------------- Performed By  Performed By:     Tricia Williams         Ref. Address:     [facility]                                                             [place]                                                             [place], [place]  Attending:        Abram Sander MD         Location:         [facility]  Referred By:      [employer] [facility] for                    [place] ---------------------------------------------------------------------- Orders   #  Description                                 Code   1  [place] MFM OB FOLLOW UP  67341.93  ----------------------------------------------------------------------   #  Ordered By               Order #        Accession #    Episode #   1  Aletha Halim          [id number]      [id number]     [id number]  ---------------------------------------------------------------------- Indications   [redacted] weeks gestation of pregnancy                Z3A.8    Advanced maternal age multigravida (33),       O09.522   second trimester-low risk NIPS   Poor obstetric history: Previous preterm       O09.219   delivery, antepartum(32wks)- 17P   Diabetes - Pregestational,3rd trimester -      O24.313   glyburide  ---------------------------------------------------------------------- OB History  Blood Type:            Height:  5'1"   Weight (lb):  142       BMI:  26.83  Gravidity:    4         Term:   1  Living:       3 ---------------------------------------------------------------------- Fetal Evaluation  Num Of Fetuses:     1  Fetal Heart         140  Rate(bpm):  Cardiac Activity:   Observed  Presentation:       Transverse, head to maternal right  Placenta:           Anterior, above cervical os  P. Cord Insertion:  Visualized  Amniotic Fluid  AFI FV:      Subjectively within normal limits  AFI Sum(cm)     %Tile       Largest Pocket(cm)  15.5            55          5.44  RUQ(cm)       RLQ(cm)       LUQ(cm)        LLQ(cm)  5.44          3.72          3.53           2.81 ---------------------------------------------------------------------- Biometry  BPD:      82.8  mm     G. Age:  33w 2d         84  %    CI:        78.45   %    70 - 86                                                          FL/HC:      20.3   %    19.1 - 21.3  HC:      295.7  mm     G. Age:  32w 5d         39  %    HC/AC:      0.96        0.96 - 1.17  AC:      306.8  mm     G. Age:  34w 5d       > 97  %    FL/BPD:  72.3   %    71 - 87  FL:       59.9  mm     G. Age:  31w 1d         23  %    FL/AC:      19.5   %    20 - 24  CER:      43.4  mm     G. Age:  37w 3d       > 95  %  Est. FW:    2175  gm    4 lb 13 oz      80  % ---------------------------------------------------------------------- Gestational Age  LMP:           31w 5d        Date:  04/02/16                 EDD:   01/07/17  U/S Today:     33w 0d                                        EDD:   12/29/16  Best:          31w 5d     Det. By:  LMP   (04/02/16)          EDD:   01/07/17 ---------------------------------------------------------------------- Anatomy  Cranium:               Appears normal         Aortic Arch:            Previously seen  Cavum:                 Appears normal         Ductal Arch:            Previously seen  Ventricles:            Appears normal         Diaphragm:              Previously seen  Choroid Plexus:        Previously seen        Stomach:                Appears normal, left                                                                        sided  Cerebellum:            Previously seen        Abdomen:                Appears normal  Posterior Fossa:       Previously seen        Abdominal Wall:         Previously seen  Nuchal Fold:           Previously seen        Cord Vessels:           Appears normal (3  vessel cord)  Face:                  Orbits and profile     Kidneys:                Appear normal                         previously seen  Lips:                  Previously seen        Bladder:                Appears normal  Thoracic:              Appears normal         Spine:                  Previously seen  Heart:                 Previously seen        Upper Extremities:      Previously seen  RVOT:                  Previously seen        Lower Extremities:      Previously seen  LVOT:                  Appears normal  Other:  Female gender. Heels and 5th digit previously visualized. ---------------------------------------------------------------------- Cervix Uterus Adnexa  Cervix  Length:           4.28  cm.  Normal appearance by transabdominal scan.  Uterus  Normal shape and size.  Left Ovary  Not visualized. No adnexal mass visualized.  Right Ovary  Not visualized. No adnexal mass visualized. ---------------------------------------------------------------------- Impression  Single living intrauterine pregnancy at [redacted]w[redacted]d.  Transverse presentation.   Anterior placenta.  Overall fetal growth 80%ile. AC>97%ile.  Normal amniotic fluid volume.  Normal interval fetal anatomy. ---------------------------------------------------------------------- Recommendations  Recommend follow-up ultrasound for growth in 4 weeks.  Antenatal testing at 32 weeks.  Delivery by 39 weeks if maternal glucose control and fetal  testing remain reassuring. ----------------------------------------------------------------------                   Abram Sander, MD Electronically Signed Final Report   11/10/2016 10:38 am ----------------------------------------------------------------------   Assessment and Plan:  Pregnancy: [id number] at [redacted]w[redacted]d  1. GDM, class A2 Good blood sugars.  EFW 81%, AC 97%.  Rescan scheduled in 3 months. BPP 10/10. Continue recommended antenatal testing and prenatal care.  2. History of preterm delivery, currently pregnant Continue weekly 17P.  3. Elderly multigravida in third trimester 4. Supervision of high risk pregnancy, antepartum Pelvic cultures done today. - Culture, beta strep (group b only) - GC/Chlamydia probe amp ([place])not at [facility] Preterm labor symptoms and general obstetric precautions including but not limited to vaginal bleeding, contractions, leaking of fluid and fetal movement were reviewed in detail with the patient. Please refer to After Visit Summary for other counseling recommendations.  Return for NST, BPP, OB visit ([place]).   Verita Schneiders, MD

## 2016-12-08 NOTE — Patient Instructions (Signed)
Regrese a la clinica cuando tenga su cita. Si tiene problemas o preguntas, llama a la clinica o vaya a la sala de emergencia al [facility].    

## 2016-12-09 LAB — GC/CHLAMYDIA PROBE AMP (~~LOC~~) NOT AT ARMC
Chlamydia: NEGATIVE
Neisseria Gonorrhea: NEGATIVE

## 2016-12-12 LAB — CULTURE, BETA STREP (GROUP B ONLY): STREP GP B CULTURE: NEGATIVE

## 2016-12-15 ENCOUNTER — Ambulatory Visit (INDEPENDENT_AMBULATORY_CARE_PROVIDER_SITE_OTHER): Payer: Self-pay | Admitting: Obstetrics & Gynecology

## 2016-12-15 ENCOUNTER — Ambulatory Visit: Payer: Self-pay

## 2016-12-15 ENCOUNTER — Ambulatory Visit (INDEPENDENT_AMBULATORY_CARE_PROVIDER_SITE_OTHER): Payer: Self-pay | Admitting: *Deleted

## 2016-12-15 VITALS — BP 99/49 | HR 57 | Wt 146.0 lb

## 2016-12-15 DIAGNOSIS — O09523 Supervision of elderly multigravida, third trimester: Secondary | ICD-10-CM

## 2016-12-15 DIAGNOSIS — O0990 Supervision of high risk pregnancy, unspecified, unspecified trimester: Secondary | ICD-10-CM

## 2016-12-15 DIAGNOSIS — O09899 Supervision of other high risk pregnancies, unspecified trimester: Secondary | ICD-10-CM

## 2016-12-15 DIAGNOSIS — O09213 Supervision of pregnancy with history of pre-term labor, third trimester: Secondary | ICD-10-CM

## 2016-12-15 DIAGNOSIS — O0993 Supervision of high risk pregnancy, unspecified, third trimester: Secondary | ICD-10-CM

## 2016-12-15 DIAGNOSIS — O24419 Gestational diabetes mellitus in pregnancy, unspecified control: Secondary | ICD-10-CM

## 2016-12-15 DIAGNOSIS — O09219 Supervision of pregnancy with history of pre-term labor, unspecified trimester: Secondary | ICD-10-CM

## 2016-12-15 LAB — POCT URINALYSIS DIP (DEVICE)
BILIRUBIN URINE: NEGATIVE
Glucose, UA: NEGATIVE mg/dL
HGB URINE DIPSTICK: NEGATIVE
Ketones, ur: NEGATIVE mg/dL
LEUKOCYTES UA: NEGATIVE
NITRITE: NEGATIVE
PH: 7 (ref 5.0–8.0)
Protein, ur: NEGATIVE mg/dL
Specific Gravity, Urine: 1.02 (ref 1.005–1.030)
UROBILINOGEN UA: 0.2 mg/dL (ref 0.0–1.0)

## 2016-12-15 NOTE — Patient Instructions (Signed)
Labor Induction Labor induction is when steps are taken to cause a pregnant woman to begin the labor process. Most women go into labor on their own between 37 weeks and 42 weeks of the pregnancy. When this does not happen or when there is a medical need, methods may be used to induce labor. Labor induction causes a pregnant woman's uterus to contract. It also causes the cervix to soften (ripen), open (dilate), and thin out (efface). Usually, labor is not induced before 39 weeks of the pregnancy unless there is a problem with the baby or mother. Before inducing labor, your health care provider will consider a number of factors, including the following:  The medical condition of you and the baby.  How many weeks along you are.  The status of the baby's lung maturity.  The condition of the cervix.  The position of the baby. What are the reasons for labor induction? Labor may be induced for the following reasons:  The health of the baby or mother is at risk.  The pregnancy is overdue by 1 week or more.  The water breaks but labor does not start on its own.  The mother has a health condition or serious illness, such as high blood pressure, infection, placental abruption, or diabetes.  The amniotic fluid amounts are low around the baby.  The baby is distressed. Convenience or wanting the baby to be born on a certain date is not a reason for inducing labor. What methods are used for labor induction? Several methods of labor induction may be used, such as:  Prostaglandin medicine. This medicine causes the cervix to dilate and ripen. The medicine will also start contractions. It can be taken by mouth or by inserting a suppository into the vagina.  Inserting a thin tube (catheter) with a balloon on the end into the vagina to dilate the cervix. Once inserted, the balloon is expanded with water, which causes the cervix to open.  Stripping the membranes. Your health care provider separates  amniotic sac tissue from the cervix, causing the cervix to be stretched and causing the release of a hormone called progesterone. This may cause the uterus to contract. It is often done during an office visit. You will be sent home to wait for the contractions to begin. You will then come in for an induction.  Breaking the water. Your health care provider makes a hole in the amniotic sac using a small instrument. Once the amniotic sac breaks, contractions should begin. This may still take hours to see an effect.  Medicine to trigger or strengthen contractions. This medicine is given through an IV access tube inserted into a vein in your arm. All of the methods of induction, besides stripping the membranes, will be done in the hospital. Induction is done in the hospital so that you and the baby can be carefully monitored. How long does it take for labor to be induced? Some inductions can take up to 2-3 days. Depending on the cervix, it usually takes less time. It takes longer when you are induced early in the pregnancy or if this is your first pregnancy. If a mother is still pregnant and the induction has been going on for 2-3 days, either the mother will be sent home or a cesarean delivery will be needed. What are the risks associated with labor induction? Some of the risks of induction include:  Changes in fetal heart rate, such as too high, too low, or erratic.  Fetal distress.    Chance of infection for the mother and baby.  Increased chance of having a cesarean delivery.  Breaking off (abruption) of the placenta from the uterus (rare).  Uterine rupture (very rare). When induction is needed for medical reasons, the benefits of induction may outweigh the risks. What are some reasons for not inducing labor? Labor induction should not be done if:  It is shown that your baby does not tolerate labor.  You have had previous surgeries on your uterus, such as a myomectomy or the removal of  fibroids.  Your placenta lies very low in the uterus and blocks the opening of the cervix (placenta previa).  Your baby is not in a head-down position.  The umbilical cord drops down into the birth canal in front of the baby. This could cut off the baby's blood and oxygen supply.  You have had a previous cesarean delivery.  There are unusual circumstances, such as the baby being extremely premature. This information is not intended to replace advice given to you by your health care provider. Make sure you discuss any questions you have with your health care provider. Document Released: 08/17/2006 Document Revised: 09/03/2015 Document Reviewed: 10/25/2012 Elsevier Interactive Patient Education  2017 Elsevier Inc.  

## 2016-12-15 NOTE — Progress Notes (Signed)

## 2016-12-15 NOTE — Progress Notes (Signed)
[place] for growth and BPP scheduled on 9/20

## 2016-12-15 NOTE — Progress Notes (Signed)
   PRENATAL VISIT NOTE  Subjective:  Tricia Williams is a 41 y.o. [phone] at [redacted]w[redacted]d being seen today for ongoing prenatal care.  She is currently monitored for the following issues for this high-risk pregnancy and has Environmental allergies; Advanced maternal age in multigravida; Supervision of high risk pregnancy, antepartum; History of preterm delivery, currently pregnant; GDM, class A2; Language barrier; Sebaceous cyst of breast, left; and Bradycardia on her problem list.  Patient reports no complaints.  Contractions: Irregular. Vag. Bleeding: None.  Movement: Present. Denies leaking of fluid.   The following portions of the patient's history were reviewed and updated as appropriate: allergies, current medications, past family history, past medical history, past social history, past surgical history and problem list. Problem list updated.  Objective:   Vitals:   12/15/16 0752  BP: (!) 99/49  Pulse: (!) 57  Weight: 146 lb (66.2 kg)   Fetal Status: Fetal Heart Rate (bpm): NST Fundal Height: 36 cm Movement: Present     General:  Alert, oriented and cooperative. Patient is in no acute distress.  Skin: Skin is warm and dry. No rash noted.   Cardiovascular: Normal heart rate noted  Respiratory: Normal respiratory effort, no problems with respiration noted  Abdomen: Soft, gravid, appropriate for gestational age. Pain/Pressure: Present     Pelvic:  Cervical exam deferred        Extremities: Normal range of motion.  Edema: None  Mental Status: Normal mood and affect. Normal behavior. Normal judgment and thought content.   Assessment and Plan:  Pregnancy: [id number] at [redacted]w[redacted]d  1. GDM, class A2 - Fasting sugars mildly elevated in 90s, post-prandial within range - Glyburide 2.5mg  at breakfast - [place] MFM FETAL BPP WO NON STRESS on 9/20  2. Elderly multigravida in third trimester - [place] MFM FETAL BPP WO NON STRESS; Future  3. History of preterm delivery, currently pregnant  4.  Supervision of high risk pregnancy, antepartum - NST & BPP wnl - Schedule IOL for 9/22 at next visit - Return in 1wk for NST/ BPP  5. Interpreter used  Preterm labor symptoms and general obstetric precautions including but not limited to vaginal bleeding, contractions, leaking of fluid and fetal movement were reviewed in detail with the patient. Please refer to After Visit Summary for other counseling recommendations.  Return in about 7 days (around 12/22/2016) for Ob fu and NST/BPP; 9/20 Ob fu and NST - has [place] @ 0845.  Augustina Mood, Medical Student  Attestation of Attending Supervision of Medical Student: Evaluation and management procedures were performed by the medical student under my supervision and collaboration.  I have reviewed the student's note and chart, and I agree with the management and plan.  Emeterio Reeve, MD, [place] Attending [place], [facility]

## 2016-12-22 ENCOUNTER — Ambulatory Visit: Payer: Self-pay

## 2016-12-22 ENCOUNTER — Ambulatory Visit (INDEPENDENT_AMBULATORY_CARE_PROVIDER_SITE_OTHER): Payer: Self-pay | Admitting: *Deleted

## 2016-12-22 ENCOUNTER — Ambulatory Visit (INDEPENDENT_AMBULATORY_CARE_PROVIDER_SITE_OTHER): Payer: Self-pay | Admitting: Obstetrics and Gynecology

## 2016-12-22 VITALS — BP 107/51 | HR 53 | Wt 146.1 lb

## 2016-12-22 DIAGNOSIS — Z789 Other specified health status: Secondary | ICD-10-CM

## 2016-12-22 DIAGNOSIS — O09523 Supervision of elderly multigravida, third trimester: Secondary | ICD-10-CM

## 2016-12-22 DIAGNOSIS — O0990 Supervision of high risk pregnancy, unspecified, unspecified trimester: Secondary | ICD-10-CM

## 2016-12-22 DIAGNOSIS — O24419 Gestational diabetes mellitus in pregnancy, unspecified control: Secondary | ICD-10-CM

## 2016-12-22 DIAGNOSIS — O0993 Supervision of high risk pregnancy, unspecified, third trimester: Secondary | ICD-10-CM

## 2016-12-22 NOTE — Progress Notes (Signed)

## 2016-12-22 NOTE — Progress Notes (Signed)
Tricia Williams

## 2016-12-22 NOTE — Progress Notes (Signed)
Prenatal Visit Note Date: 12/22/2016 Clinic: [facility]  Subjective:  Tricia Williams is a 41 y.o. [phone] at [redacted]w[redacted]d being seen today for ongoing prenatal care.  She is currently monitored for the following issues for this high-risk pregnancy and has Environmental allergies; Advanced maternal age in multigravida; Supervision of high risk pregnancy, antepartum; History of preterm delivery, currently pregnant; GDM, class A2; Language barrier; Sebaceous cyst of breast, left; and Bradycardia on her problem list.  Patient reports no complaints.   Contractions: Irregular. Vag. Bleeding: None.  Movement: Present. Denies leaking of fluid.   The following portions of the patient's history were reviewed and updated as appropriate: allergies, current medications, past family history, past medical history, past social history, past surgical history and problem list. Problem list updated.  Objective:   Vitals:   12/22/16 1018  BP: (!) 107/51  Pulse: (!) 53  Weight: 146 lb 1.6 oz (66.3 kg)    Fetal Status: Fetal Heart Rate (bpm): NST Fundal Height: 37 cm Movement: Present     General:  Alert, oriented and cooperative. Patient is in no acute distress.  Skin: Skin is warm and dry. No rash noted.   Cardiovascular: Normal heart rate noted  Respiratory: Normal respiratory effort, no problems with respiration noted  Abdomen: Soft, gravid, appropriate for gestational age. Pain/Pressure: Present     Pelvic:  Cervical exam deferred        Extremities: Normal range of motion.  Edema: None  Mental Status: Normal mood and affect. Normal behavior. Normal judgment and thought content.   Urinalysis:      Assessment and Plan:  Pregnancy: [id number] at [redacted]w[redacted]d  1. GDM, class A2 Normal AM fasting and 2hr breakfast and rare slightly elevated 2hr lunch and borderline (close to 25% abnormal) for 2h dinner. She endorses bad diet on those instances. Continue with glyburide 2.5 with breakfast  and d/w her to keep eye on diet, but hopefully can hold off on dinner dosing since so close to IOL date. Follow up NST and BPP for today. Has 9/20 rpt growth, bpp, nst already scheduled and will set up for 9/22 IOL date  2. Elderly multigravida in third trimester See above  3. Supervision of high risk pregnancy, antepartum  4. Language barrier Interpreter used.   Term labor symptoms and general obstetric precautions including but not limited to vaginal bleeding, contractions, leaking of fluid and fetal movement were reviewed in detail with the patient. Please refer to After Visit Summary for other counseling recommendations.  Return in about 1 week (around 12/29/2016) for rob.   Aletha Halim, MD

## 2016-12-23 ENCOUNTER — Telehealth (HOSPITAL_COMMUNITY): Payer: Self-pay | Admitting: *Deleted

## 2016-12-26 ENCOUNTER — Encounter (HOSPITAL_COMMUNITY): Payer: Self-pay | Admitting: *Deleted

## 2016-12-26 NOTE — Telephone Encounter (Signed)
Preadmission screen Interpreter number [phone]

## 2016-12-28 ENCOUNTER — Other Ambulatory Visit: Payer: Self-pay | Admitting: Advanced Practice Midwife

## 2016-12-29 ENCOUNTER — Ambulatory Visit: Payer: Self-pay | Admitting: *Deleted

## 2016-12-29 ENCOUNTER — Encounter (HOSPITAL_COMMUNITY): Payer: Self-pay

## 2016-12-29 ENCOUNTER — Ambulatory Visit (HOSPITAL_COMMUNITY)
Admission: RE | Admit: 2016-12-29 | Discharge: 2016-12-29 | Disposition: A | Discharge status: Home / Self Care | Payer: Self-pay | Source: Ambulatory Visit | Attending: Obstetrics and Gynecology | Admitting: Obstetrics and Gynecology

## 2016-12-29 ENCOUNTER — Ambulatory Visit (INDEPENDENT_AMBULATORY_CARE_PROVIDER_SITE_OTHER): Payer: Self-pay | Admitting: Obstetrics & Gynecology

## 2016-12-29 VITALS — BP 116/55 | HR 54 | Wt 146.6 lb

## 2016-12-29 DIAGNOSIS — O09523 Supervision of elderly multigravida, third trimester: Secondary | ICD-10-CM

## 2016-12-29 DIAGNOSIS — O24419 Gestational diabetes mellitus in pregnancy, unspecified control: Secondary | ICD-10-CM

## 2016-12-29 DIAGNOSIS — Z23 Encounter for immunization: Secondary | ICD-10-CM

## 2016-12-29 DIAGNOSIS — O09213 Supervision of pregnancy with history of pre-term labor, third trimester: Secondary | ICD-10-CM | POA: Insufficient documentation

## 2016-12-29 DIAGNOSIS — O0990 Supervision of high risk pregnancy, unspecified, unspecified trimester: Secondary | ICD-10-CM

## 2016-12-29 DIAGNOSIS — O24415 Gestational diabetes mellitus in pregnancy, controlled by oral hypoglycemic drugs: Secondary | ICD-10-CM | POA: Insufficient documentation

## 2016-12-29 DIAGNOSIS — O0993 Supervision of high risk pregnancy, unspecified, third trimester: Secondary | ICD-10-CM

## 2016-12-29 DIAGNOSIS — Z3A38 38 weeks gestation of pregnancy: Secondary | ICD-10-CM | POA: Insufficient documentation

## 2016-12-29 NOTE — Progress Notes (Signed)
[place] for growth and BPP done today.  EFW - 3751 gm (>90%); AFI - 11.46 cm;  BPP - 8/8.  IOL scheduled 9/22 @ 0700.     PRENATAL VISIT NOTE  Subjective:  Tricia Williams is a 41 y.o. [phone] at [redacted]w[redacted]d being seen today for ongoing prenatal care.  She is currently monitored for the following issues for this high-risk pregnancy and has Environmental allergies; Advanced maternal age in multigravida; Supervision of high risk pregnancy, antepartum; History of preterm delivery, currently pregnant; GDM, class A2; Language barrier; Sebaceous cyst of breast, left; and Bradycardia on her problem list.  Patient reports no complaints.  Contractions: Irregular. Vag. Bleeding: None.  Movement: Present. Denies leaking of fluid.   The following portions of the patient's history were reviewed and updated as appropriate: allergies, current medications, past family history, past medical history, past social history, past surgical history and problem list. Problem list updated.  Objective:   Vitals:   12/29/16 1051  BP: (!) 116/55  Pulse: (!) 54  Weight: 146 lb 9.6 oz (66.5 kg)    Fetal Status: Fetal Heart Rate (bpm): NST   Movement: Present     General:  Alert, oriented and cooperative. Patient is in no acute distress.  Skin: Skin is warm and dry. No rash noted.   Cardiovascular: Normal heart rate noted  Respiratory: Normal respiratory effort, no problems with respiration noted  Abdomen: Soft, gravid, appropriate for gestational age.  Pain/Pressure: Present     Pelvic: Cervical exam deferred        Extremities: Normal range of motion.  Edema: None  Mental Status:  Normal mood and affect. Normal behavior. Normal judgment and thought content.   Assessment and Plan:  Pregnancy: [id number] at [redacted]w[redacted]d  1. Supervision of high risk pregnancy, antepartum -flu shot  2. Elderly multigravida in third trimester - Fetal nonstress test--Reactive  3. GDM, class A2 All CBGs are with in range - Fetal nonstress  test -Single living intrauterine pregnancy at 38w 5d.  Cephalic presentation.  Placenta Anterior, above cervical os.  Normal amniotic fluid volume.  Composite fetal growth> 90%ile. AC>97%ile.  Normal interval fetal anatomy.  BPP 8/8. IOL 9/22  Term labor symptoms and general obstetric precautions including but not limited to vaginal bleeding, contractions, leaking of fluid and fetal movement were reviewed in detail with the patient. Please refer to After Visit Summary for other counseling recommendations.  Return in about 7 weeks (around 02/13/2017) for PP visit and 2 hr GTT.   Silas Sacramento, MD

## 2016-12-30 ENCOUNTER — Inpatient Hospital Stay (HOSPITAL_COMMUNITY): Payer: Medicaid Other | Admitting: Anesthesiology

## 2016-12-30 ENCOUNTER — Encounter (HOSPITAL_COMMUNITY): Payer: Self-pay

## 2016-12-30 ENCOUNTER — Inpatient Hospital Stay (HOSPITAL_COMMUNITY)
Admission: AD | Admit: 2016-12-30 | Discharge: 2016-12-31 | DRG: 775 | Disposition: A | Discharge status: Home / Self Care | Payer: Medicaid Other | Source: Ambulatory Visit | Attending: Obstetrics & Gynecology | Admitting: Obstetrics & Gynecology

## 2016-12-30 DIAGNOSIS — O09219 Supervision of pregnancy with history of pre-term labor, unspecified trimester: Secondary | ICD-10-CM

## 2016-12-30 DIAGNOSIS — Z3A38 38 weeks gestation of pregnancy: Secondary | ICD-10-CM | POA: Diagnosis not present

## 2016-12-30 DIAGNOSIS — Z7982 Long term (current) use of aspirin: Secondary | ICD-10-CM | POA: Diagnosis not present

## 2016-12-30 DIAGNOSIS — O09523 Supervision of elderly multigravida, third trimester: Secondary | ICD-10-CM

## 2016-12-30 DIAGNOSIS — O24425 Gestational diabetes mellitus in childbirth, controlled by oral hypoglycemic drugs: Principal | ICD-10-CM | POA: Diagnosis present

## 2016-12-30 DIAGNOSIS — O26893 Other specified pregnancy related conditions, third trimester: Secondary | ICD-10-CM | POA: Diagnosis present

## 2016-12-30 DIAGNOSIS — O24415 Gestational diabetes mellitus in pregnancy, controlled by oral hypoglycemic drugs: Secondary | ICD-10-CM | POA: Diagnosis present

## 2016-12-30 DIAGNOSIS — O0990 Supervision of high risk pregnancy, unspecified, unspecified trimester: Secondary | ICD-10-CM

## 2016-12-30 DIAGNOSIS — O09899 Supervision of other high risk pregnancies, unspecified trimester: Secondary | ICD-10-CM

## 2016-12-30 DIAGNOSIS — O24429 Gestational diabetes mellitus in childbirth, unspecified control: Secondary | ICD-10-CM

## 2016-12-30 LAB — GLUCOSE, CAPILLARY
Glucose-Capillary: 89 mg/dL (ref 65–99)
Glucose-Capillary: 93 mg/dL (ref 65–99)
Glucose-Capillary: 77 mg/dL (ref 65–99)
Glucose-Capillary: 94 mg/dL (ref 65–99)

## 2016-12-30 LAB — RPR: RPR Ser Ql: NONREACTIVE

## 2016-12-30 LAB — CBC
HEMATOCRIT: 37.9 % (ref 36.0–46.0)
Hemoglobin: 13.1 g/dL (ref 12.0–15.0)
MCH: 31.6 pg (ref 26.0–34.0)
MCHC: 34.6 g/dL (ref 30.0–36.0)
MCV: 91.3 fL (ref 78.0–100.0)
Platelets: 191 10*3/uL (ref 150–400)
RBC: 4.15 MIL/uL (ref 3.87–5.11)
RDW: 13.9 % (ref 11.5–15.5)
WBC: 12.4 10*3/uL — AB (ref 4.0–10.5)

## 2016-12-30 LAB — TYPE AND SCREEN
ABO/RH(D): O POS
ANTIBODY SCREEN: NEGATIVE

## 2016-12-30 LAB — ABO/RH: ABO/RH(D): O POS

## 2016-12-30 MED ORDER — OXYCODONE-ACETAMINOPHEN 5-325 MG PO TABS: 2.0000 | ORAL_TABLET | ORAL | Status: DC | PRN

## 2016-12-30 MED ORDER — LACTATED RINGERS IV SOLN: INTRAVENOUS | Status: DC

## 2016-12-30 MED ORDER — WITCH HAZEL-GLYCERIN EX PADS: 1.0000 "application " | MEDICATED_PAD | CUTANEOUS | Status: DC | PRN

## 2016-12-30 MED ORDER — FENTANYL CITRATE (PF) 100 MCG/2ML IJ SOLN: 100.0000 ug | INTRAMUSCULAR | Status: DC | PRN

## 2016-12-30 MED ORDER — LACTATED RINGERS IV SOLN
INTRAVENOUS | Status: DC
  Administered 2016-12-30: 03:00:00 via INTRAVENOUS

## 2016-12-30 MED ORDER — TERBUTALINE SULFATE 1 MG/ML IJ SOLN
0.2500 mg | Freq: Once | INTRAMUSCULAR | Status: DC | PRN
  Filled 2016-12-30: qty 1

## 2016-12-30 MED ORDER — SIMETHICONE 80 MG PO CHEW: 80.0000 mg | CHEWABLE_TABLET | ORAL | Status: DC | PRN

## 2016-12-30 MED ORDER — ACETAMINOPHEN 325 MG PO TABS
650.0000 mg | ORAL_TABLET | ORAL | Status: DC | PRN
  Administered 2016-12-30: 650 mg via ORAL
  Filled 2016-12-30: qty 2

## 2016-12-30 MED ORDER — EPHEDRINE 5 MG/ML INJ
10.0000 mg | INTRAVENOUS | Status: DC | PRN
  Filled 2016-12-30: qty 2

## 2016-12-30 MED ORDER — DIBUCAINE 1 % RE OINT: 1.0000 "application " | TOPICAL_OINTMENT | RECTAL | Status: DC | PRN

## 2016-12-30 MED ORDER — OXYCODONE-ACETAMINOPHEN 5-325 MG PO TABS: 1.0000 | ORAL_TABLET | ORAL | Status: DC | PRN

## 2016-12-30 MED ORDER — ONDANSETRON HCL 4 MG/2ML IJ SOLN: 4.0000 mg | Freq: Four times a day (QID) | INTRAMUSCULAR | Status: DC | PRN

## 2016-12-30 MED ORDER — OXYTOCIN BOLUS FROM INFUSION: 500.0000 mL | Freq: Once | INTRAVENOUS | Status: DC

## 2016-12-30 MED ORDER — LACTATED RINGERS IV SOLN: 500.0000 mL | INTRAVENOUS | Status: DC | PRN

## 2016-12-30 MED ORDER — SOD CITRATE-CITRIC ACID 500-334 MG/5ML PO SOLN: 30.0000 mL | ORAL | Status: DC | PRN

## 2016-12-30 MED ORDER — PRENATAL MULTIVITAMIN CH
1.0000 | ORAL_TABLET | Freq: Every day | ORAL | Status: DC
  Administered 2016-12-31: 1 via ORAL
  Filled 2016-12-30: qty 1

## 2016-12-30 MED ORDER — BENZOCAINE-MENTHOL 20-0.5 % EX AERO: 1.0000 "application " | INHALATION_SPRAY | CUTANEOUS | Status: DC | PRN

## 2016-12-30 MED ORDER — SENNOSIDES-DOCUSATE SODIUM 8.6-50 MG PO TABS
2.0000 | ORAL_TABLET | ORAL | Status: DC
  Administered 2016-12-30: 2 via ORAL
  Filled 2016-12-30: qty 2

## 2016-12-30 MED ORDER — ZOLPIDEM TARTRATE 5 MG PO TABS: 5.0000 mg | ORAL_TABLET | Freq: Every evening | ORAL | Status: DC | PRN

## 2016-12-30 MED ORDER — ACETAMINOPHEN 325 MG PO TABS: 650.0000 mg | ORAL_TABLET | ORAL | Status: DC | PRN

## 2016-12-30 MED ORDER — LIDOCAINE HCL (PF) 1 % IJ SOLN
30.0000 mL | INTRAMUSCULAR | Status: DC | PRN
  Filled 2016-12-30: qty 30

## 2016-12-30 MED ORDER — PHENYLEPHRINE 40 MCG/ML (10ML) SYRINGE FOR IV PUSH (FOR BLOOD PRESSURE SUPPORT)
80.0000 ug | PREFILLED_SYRINGE | INTRAVENOUS | Status: DC | PRN
  Administered 2016-12-30: 80 ug via INTRAVENOUS
  Filled 2016-12-30: qty 5

## 2016-12-30 MED ORDER — LIDOCAINE HCL (PF) 1 % IJ SOLN: 30.0000 mL | INTRAMUSCULAR | Status: DC | PRN

## 2016-12-30 MED ORDER — TETANUS-DIPHTH-ACELL PERTUSSIS 5-2.5-18.5 LF-MCG/0.5 IM SUSP: 0.5000 mL | Freq: Once | INTRAMUSCULAR | Status: DC

## 2016-12-30 MED ORDER — OXYTOCIN BOLUS FROM INFUSION
500.0000 mL | Freq: Once | INTRAVENOUS | Status: AC
  Administered 2016-12-30: 500 mL via INTRAVENOUS

## 2016-12-30 MED ORDER — FENTANYL 2.5 MCG/ML BUPIVACAINE 1/10 % EPIDURAL INFUSION (WH - ANES)
14.0000 mL/h | INTRAMUSCULAR | Status: DC | PRN
  Administered 2016-12-30 (×2): 14 mL/h via EPIDURAL
  Filled 2016-12-30 (×2): qty 100

## 2016-12-30 MED ORDER — IBUPROFEN 600 MG PO TABS
600.0000 mg | ORAL_TABLET | Freq: Four times a day (QID) | ORAL | Status: DC
  Administered 2016-12-30 – 2016-12-31 (×4): 600 mg via ORAL
  Filled 2016-12-30 (×4): qty 1

## 2016-12-30 MED ORDER — OXYTOCIN 40 UNITS IN LACTATED RINGERS INFUSION - SIMPLE MED: 2.5000 [IU]/h | INTRAVENOUS | Status: DC

## 2016-12-30 MED ORDER — ONDANSETRON HCL 4 MG PO TABS: 4.0000 mg | ORAL_TABLET | ORAL | Status: DC | PRN

## 2016-12-30 MED ORDER — OXYTOCIN 40 UNITS IN LACTATED RINGERS INFUSION - SIMPLE MED
1.0000 m[IU]/min | INTRAVENOUS | Status: DC
  Administered 2016-12-30: 2 m[IU]/min via INTRAVENOUS

## 2016-12-30 MED ORDER — ONDANSETRON HCL 4 MG/2ML IJ SOLN: 4.0000 mg | INTRAMUSCULAR | Status: DC | PRN

## 2016-12-30 MED ORDER — DIPHENHYDRAMINE HCL 25 MG PO CAPS: 25.0000 mg | ORAL_CAPSULE | Freq: Four times a day (QID) | ORAL | Status: DC | PRN

## 2016-12-30 MED ORDER — LACTATED RINGERS IV SOLN
500.0000 mL | Freq: Once | INTRAVENOUS | Status: AC
  Administered 2016-12-30: 500 mL via INTRAVENOUS

## 2016-12-30 MED ORDER — COCONUT OIL OIL: 1.0000 "application " | TOPICAL_OIL | Status: DC | PRN

## 2016-12-30 MED ORDER — MISOPROSTOL 25 MCG QUARTER TABLET
25.0000 ug | ORAL_TABLET | ORAL | Status: DC | PRN
Start: 2016-12-30 — End: 2016-12-30
  Filled 2016-12-30: qty 1

## 2016-12-30 MED ORDER — PHENYLEPHRINE 40 MCG/ML (10ML) SYRINGE FOR IV PUSH (FOR BLOOD PRESSURE SUPPORT)
80.0000 ug | PREFILLED_SYRINGE | INTRAVENOUS | Status: DC | PRN
  Filled 2016-12-30: qty 10
  Filled 2016-12-30: qty 5
  Filled 2016-12-30: qty 10

## 2016-12-30 MED ORDER — LIDOCAINE HCL (PF) 1 % IJ SOLN
INTRAMUSCULAR | Status: DC | PRN
  Administered 2016-12-30 (×2): 5 mL

## 2016-12-30 MED ORDER — DIPHENHYDRAMINE HCL 50 MG/ML IJ SOLN: 12.5000 mg | INTRAMUSCULAR | Status: DC | PRN

## 2016-12-30 MED ORDER — OXYTOCIN 40 UNITS IN LACTATED RINGERS INFUSION - SIMPLE MED
2.5000 [IU]/h | INTRAVENOUS | Status: DC
  Filled 2016-12-30: qty 1000

## 2016-12-30 NOTE — Progress Notes (Signed)
Patient doing well.  Contractions have spaced out now and not as painful per her report.  She does feel more pressure in her bottom now.  No other concerns.  An interpreter was used for the entirety of this encounter.   FHT: baseline 145, moderate variability, +accels, one late decel (not prolonged) TOCO: q1-6 min   SVE: 6/80/-1   AROM with return of small mount of heavy meconium Continue expectant management; if ctx pattern does not improve following AROM, then will initiate augmentation with pitocin   Regenia Skeeter, DO PGY-2 Family Medicine Resident

## 2016-12-30 NOTE — Anesthesia Procedure Notes (Signed)
Epidural Patient location during procedure: OB  Staffing Anesthesiologist: Haakon Titsworth Performed: anesthesiologist   Preanesthetic Checklist Completed: patient identified, site marked, surgical consent, pre-op evaluation, timeout performed, IV checked, risks and benefits discussed and monitors and equipment checked  Epidural Patient position: sitting Prep: DuraPrep Patient monitoring: heart rate, continuous pulse ox and blood pressure Approach: right paramedian Location: L3-L4 Injection technique: LOR saline  Needle:  Needle type: Tuohy  Needle gauge: 17 G Needle length: 9 cm and 9 Needle insertion depth: 6 cm Catheter type: closed end flexible Catheter size: 20 Guage Catheter at skin depth: 10 cm Test dose: negative  Assessment Events: blood not aspirated, injection not painful, no injection resistance, negative IV test and no paresthesia  Additional Notes Patient identified. Risks/Benefits/Options discussed with patient including but not limited to bleeding, infection, nerve damage, paralysis, failed block, incomplete pain control, headache, blood pressure changes, nausea, vomiting, reactions to medication both or allergic, itching and postpartum back pain. Confirmed with bedside nurse the patient's most recent platelet count. Confirmed with patient that they are not currently taking any anticoagulation, have any bleeding history or any family history of bleeding disorders. Patient expressed understanding and wished to proceed. All questions were answered. Sterile technique was used throughout the entire procedure. Please see nursing notes for vital signs. Test dose was given through epidural needle and negative prior to continuing to dose epidural or start infusion. Warning signs of high block given to the patient including shortness of breath, tingling/numbness in hands, complete motor block, or any concerning symptoms with instructions to call for help. Patient was given  instructions on fall risk and not to get out of bed. All questions and concerns addressed with instructions to call with any issues.     

## 2016-12-30 NOTE — MAU Note (Signed)
Pt c/o contractions that are every 5 mins that started around 6pm. Pt denies LOF or vaginal bleeding. Reports good fetal movement . States cervix has not been checked.

## 2016-12-30 NOTE — Anesthesia Preprocedure Evaluation (Signed)
Anesthesia Evaluation  Patient identified by MRN, date of birth, ID band Patient awake    Reviewed: Allergy & Precautions, H&P , NPO status , Patient's Chart, lab work & pertinent test results  History of Anesthesia Complications Negative for: history of anesthetic complications  Airway Mallampati: II  TM Distance: >3 FB Neck ROM: full    Dental no notable dental hx. (+) Teeth Intact   Pulmonary neg pulmonary ROS,    Pulmonary exam normal breath sounds clear to auscultation       Cardiovascular negative cardio ROS Normal cardiovascular exam Rhythm:regular Rate:Normal     Neuro/Psych negative neurological ROS  negative psych ROS   GI/Hepatic negative GI ROS, Neg liver ROS,   Endo/Other  negative endocrine ROSdiabetes, Gestational  Renal/GU negative Renal ROS  negative genitourinary   Musculoskeletal   Abdominal   Peds  Hematology negative hematology ROS (+)   Anesthesia Other Findings   Reproductive/Obstetrics (+) Pregnancy                             Anesthesia Physical Anesthesia Plan  ASA: II  Anesthesia Plan: Epidural   Post-op Pain Management:    Induction:   PONV Risk Score and Plan:   Airway Management Planned:   Additional Equipment:   Intra-op Plan:   Post-operative Plan:   Informed Consent: I have reviewed the patients History and Physical, chart, labs and discussed the procedure including the risks, benefits and alternatives for the proposed anesthesia with the patient or authorized representative who has indicated his/her understanding and acceptance.     Plan Discussed with:   Anesthesia Plan Comments:         Anesthesia Quick Evaluation

## 2016-12-30 NOTE — H&P (Signed)
LABOR ADMISSION HISTORY AND PHYSICAL  Tricia Williams is a 41 y.o. female [phone] with IUP at [redacted]w[redacted]d by LMP c/w 13 wk [place] presenting for SOL. She reports +FMs, No LOF, no VB, no blurry vision, no headaches, no peripheral edema, and no RUQ pain.  She plans on breast feeding. She is undecided for birth control.  Dating: By LMP c/w 13 wk [place] --->  Estimated Date of Delivery: 01/07/17  Sono:   @[redacted]w[redacted]d , CWD, normal anatomy, cephalic presentation, 2229N, >90% EFW, AC >97%  Patient initiated prenatal care at 12 weeks at [place], transferred to [facility]  Prenatal History/Complications: [id number], on glyburide AMA  Past Medical History: Past Medical History:  Diagnosis Date  . Gestational diabetes   . Medical history non-contributory     Past Surgical History: Past Surgical History:  Procedure Laterality Date  . EYE MUSCLE SURGERY      Obstetrical History: OB History    Gravida Para Term Preterm AB Living   4 3 2 1   3    SAB TAB Ectopic Multiple Live Births           2      Social History: Social History   Social History  . Marital status: Married    Spouse name: N/A  . Number of children: N/A  . Years of education: N/A   Social History Main Topics  . Smoking status: Never Smoker  . Smokeless tobacco: Never Used  . Alcohol use No  . Drug use: No  . Sexual activity: Yes   Other Topics Concern  . None   Social History Narrative  . None    Family History: Family History  Problem Relation Age of Onset  . Diabetes Father   . Hyperlipidemia Father   . Arthritis Mother     Allergies: No Known Allergies  Facility-Administered Medications Prior to Admission  Medication Dose Route Frequency Provider Last Rate Last Dose  . hydroxyprogesterone caproate (MAKENA) 250 mg/mL injection 250 mg  250 mg Intramuscular Weekly Donnamae Jude, MD   250 mg at 12/08/16 1054   Prescriptions Prior to Admission  Medication Sig Dispense Refill Last Dose  . aspirin  EC 81 MG tablet Take 1 tablet (81 mg total) by mouth daily. 30 tablet 6 12/29/2016 at Unknown time  . glucose blood test strip Use to test blood sugar 4 times a day. 100 each 12 12/29/2016 at Unknown time  . glyBURIDE (DIABETA) 2.5 MG tablet Take 1 tablet (2.5 mg total) by mouth daily with breakfast. 60 tablet 1 12/29/2016 at Unknown time  . Prenatal Vit w/Fe-Methylfol-FA (PNV PO) Take by mouth.   12/29/2016 at Unknown time     Review of Systems   All systems reviewed and negative except as stated in HPI  Blood pressure (!) 120/54, pulse 61, temperature 98.9 F (37.2 C), temperature source Oral, resp. rate 16, last menstrual period 04/02/2016. General appearance: alert, cooperative, appears stated age and no distress Lungs: normal work of breathing Extremities: Homans sign is negative, no sign of DVT Presentation: cephalic Fetal monitoringBaseline: 135 bpm, Variability: Good {> 6 bpm), Accelerations: Reactive and Decelerations: Absent Uterine activityFrequency: Every 4 minutes Dilation: 3 Effacement (%): 80 Station: -2 Exam by:: Wende Bushy RN    Prenatal labs: ABO, Rh: O/Positive/-- (03/22 0000) Antibody: Negative (03/22 0000) Rubella: Immune (03/22 0000) RPR: Non Reactive (07/05 0953)  HBsAg: Negative (03/22 0000)  HIV:  Non Reactive GBS:  Negative GTT: Fasting- 92, 1 hr- 163, 2  hr- 175 3 hr - 182  Prenatal Transfer Tool  Maternal Diabetes: Yes:  Diabetes Type:  Insulin/Medication controlled Genetic Screening: Normal Maternal Ultrasounds/Referrals: Normal Fetal Ultrasounds or other Referrals:  None Maternal Substance Abuse:  No Significant Maternal Medications:  Meds include: Other: Glyburide Significant Maternal Lab Results: None  No results found for this or any previous visit (from the past 24 hour(s)).  Patient Active Problem List   Diagnosis Date Noted  . Bradycardia 10/27/2016  . Sebaceous cyst of breast, left 10/21/2016  . GDM, class A2 08/22/2016  . Language  barrier 08/22/2016  . History of preterm delivery, currently pregnant 08/10/2016  . Supervision of high risk pregnancy, antepartum 08/01/2016  . Advanced maternal age in multigravida 07/07/2016  . Environmental allergies 06/25/2011    Assessment: Tricia Williams is a 41 y.o. [phone] at [redacted]w[redacted]d here for SOL.  #Labor: will let patient progress without augmentation for now, will augment as needed after next cervical check #Pain: Epidural upon request  #FWB: Cat 1 #ID: GBS [id number] #MOF: breast #MOC: undecided #Circ:  No  Maia Breslow, MD Family Medicine Resident PGY-1  12/30/2016, 2:42 AM  I have seen and examined this patient and agree with the management plan.

## 2016-12-30 NOTE — Progress Notes (Signed)
Labor Progress Note  Tricia Williams is a 41 y.o. [phone] at [redacted]w[redacted]d  admitted for SOL.  S: Patient is doing well. Contractions have gotten closer together. No other concerns. Blood pressures are running low. Has received 2 boluses and phenylephrine.   O:  BP (!) 105/40   Pulse 62   Temp 98.8 F (37.1 C) (Axillary)   Resp 16   Ht 5\' 1"  (1.549 m)   Wt 66.2 kg (146 lb)   LMP 04/02/2016   SpO2 98%   BMI 27.59 kg/m   Total I/O In: -  Out: 525 [Urine:525]  FHT:  FHR: 145 bpm, variability: moderate,  accelerations:  Present,  decelerations:  Present Variable UC:   irregular, every 1.5-5 minutes SVE:   Dilation: 9 Effacement (%): 100 Station: -1, 0 Exam by:: [employer], rn AROM with heavy meconium  Pitocin @ 6 mu/min  Labs: Lab Results  Component Value Date   WBC 12.4 (H) 12/30/2016   HGB 13.1 12/30/2016   HCT 37.9 12/30/2016   MCV 91.3 12/30/2016   PLT 191 12/30/2016    Assessment / Plan: 41 y.o. [id number] [redacted]w[redacted]d presented with SOL in active labor. Soft Bps with diastolic's in the 70R. Typically runs low. Will continue to monitor.  Labor: Progressing on Pitocin. Expectant management Fetal Wellbeing:  Category II Pain Control:  Epidural Anticipated MOD:  NSVD  Expectant management  Irven Baltimore, Medical Student

## 2016-12-30 NOTE — Anesthesia Pain Management Evaluation Note (Signed)
  CRNA Pain Management Visit Note  Patient: [name], 41 y.o., female  "Hello I am a member of the anesthesia team at [facility]. We have an anesthesia team available at all times to provide care throughout the hospital, including epidural management and anesthesia for C-section. I don't know your plan for the delivery whether it a natural birth, water birth, IV sedation, nitrous supplementation, doula or epidural, but we want to meet your pain goals."   1.Was your pain managed to your expectations on prior hospitalizations?   Yes   2.What is your expectation for pain management during this hospitalization?     Epidural  3.How can we help you reach that goal? epidural  Record the patient's initial score and the patient's pain goal.   Pain: 0  Pain Goal: 5 [facility] wants you to be able to say your pain was always managed very well.  Willa Rough 12/30/2016

## 2016-12-31 ENCOUNTER — Inpatient Hospital Stay (HOSPITAL_COMMUNITY): Admission: RE | Admit: 2016-12-31 | Payer: Self-pay | Source: Ambulatory Visit

## 2016-12-31 LAB — GLUCOSE, CAPILLARY: GLUCOSE-CAPILLARY: 87 mg/dL (ref 65–99)

## 2016-12-31 MED ORDER — ACETAMINOPHEN 325 MG PO TABS: 650.0000 mg | ORAL_TABLET | ORAL | 0 refills | Status: DC | PRN

## 2016-12-31 MED ORDER — IBUPROFEN 600 MG PO TABS
600.0000 mg | ORAL_TABLET | Freq: Four times a day (QID) | ORAL | 0 refills | Status: DC
Start: 2016-12-31 — End: 2023-03-16

## 2016-12-31 NOTE — Lactation Note (Signed)
This note was copied from a baby's chart. Lactation Consultation Note  Patient Name: Tricia Williams [id number] Date: 12/31/2016 Reason for consult: Initial assessment   Spanish Interpreter Glenard Haring. Baby 69 hours old.  Baby has been sleepy today, not waking for feedings. Undressed baby to diaper.  Reviewed how to hand express with mother and gave baby drops on spoon. Assisted w/ latching.  Mother holds breast tissue away from nose.  Provided education. Intermittent sucks and swallows observed. Encouraged mother to compress breast during feeding to keep baby active Provided mother w/ manual pump.         Maternal Data Has patient been taught Hand Expression?: Yes Does the patient have breastfeeding experience prior to this delivery?: Yes  Feeding Feeding Type: Breast Fed Length of feed: 0 min (sleepy latch few weak  suckles)  LATCH Score Latch: Repeated attempts needed to sustain latch, nipple held in mouth throughout feeding, stimulation needed to elicit sucking reflex.  Audible Swallowing: A few with stimulation  Type of Nipple: Everted at rest and after stimulation  Comfort (Breast/Nipple): Soft / non-tender  Hold (Positioning): Assistance needed to correctly position infant at breast and maintain latch.  LATCH Score: 7  Interventions Interventions: Breast feeding basics reviewed;Assisted with latch;Skin to skin;Hand express;Hand pump;Expressed milk  Lactation Tools Discussed/Used     Consult Status Consult Status: Follow-up Date: 01/01/17 Follow-up type: In-patient    Vivianne Master [facility] 12/31/2016, 2:30 PM

## 2016-12-31 NOTE — Discharge Summary (Signed)
OB Discharge Summary  Patient Name: Tricia Williams DOB: [birth year] MRN: [id number]  Date of admission: 12/30/2016 Delivering MD: Katheren Shams   Date of discharge: 12/31/2016  Admitting diagnosis: 38 WEEKS CTX Intrauterine pregnancy: [redacted]w[redacted]d     Secondary diagnosis:Active Problems:   Normal labor   Gestational diabetes mellitus (GDM) controlled on oral hypoglycemic drug   SVD (spontaneous vaginal delivery)  Additional problems:none     Discharge diagnosis: Term Pregnancy Delivered and GDM A2                                                                     Post partum procedures:none  Augmentation: n/a  Complications: None  Hospital course:  Onset of Labor With Vaginal Delivery     41 y.o. yo [id number] at [redacted]w[redacted]d was admitted in Active Labor on 12/30/2016. Patient had an uncomplicated labor course as follows:  Membrane Rupture Time/Date: 10:40 AM ,12/30/2016   Intrapartum Procedures: Episiotomy: None [1]                                         Lacerations:  None [1]  Patient had a delivery of a Viable infant. 12/30/2016  Information for the patient's newborn:  Brittain, Smithey [[id number]]  Delivery Method: Vaginal, Spontaneous Delivery (Filed from Delivery Summary)    Pateint had an uncomplicated postpartum course.  She is ambulating, tolerating a regular diet, passing flatus, and urinating well. Patient is discharged home in stable condition on 12/31/16.   Physical exam  Vitals:   12/30/16 1910 12/30/16 2230 12/31/16 0647 12/31/16 0725  BP: (!) 107/52 (!) 108/44 (!) 96/53 (!) 91/48  Pulse: (!) 59 (!) 55 (!) 53 (!) 48  Resp: 18 18 18 18   Temp: 99.1 F (37.3 C) 98.3 F (36.8 C) 97.8 F (36.6 C) 98 F (36.7 C)  TempSrc: Oral Oral Oral Oral  SpO2: 99% 99%    Weight:      Height:       General: alert, cooperative and no distress Lochia: appropriate Uterine Fundus: firm Incision: N/A DVT Evaluation: No evidence of DVT seen on physical  exam. Labs: Lab Results  Component Value Date   WBC 12.4 (H) 12/30/2016   HGB 13.1 12/30/2016   HCT 37.9 12/30/2016   MCV 91.3 12/30/2016   PLT 191 12/30/2016   CMP Latest Ref Rng & Units 08/22/2016  Glucose 65 - 99 mg/dL 114(H)  BUN 6 - 24 mg/dL 10  Creatinine 0.57 - 1.00 mg/dL 0.56(L)  Sodium 134 - 144 mmol/L 138  Potassium 3.5 - 5.2 mmol/L 3.9  Chloride 96 - 106 mmol/L 105  CO2 18 - 29 mmol/L 20  Calcium 8.7 - 10.2 mg/dL 9.4  Total Protein 6.0 - 8.5 g/dL 6.5  Total Bilirubin 0.0 - 1.2 mg/dL <0.2  Alkaline Phos 39 - 117 IU/L 63  AST 0 - 40 IU/L 9  ALT 0 - 32 IU/L 11    Discharge instruction: per After Visit Summary and "Baby and Me Booklet".  After Visit Meds:  Allergies as of 12/31/2016   No Known Allergies     Medication List  STOP taking these medications   aspirin EC 81 MG tablet   glyBURIDE 2.5 MG tablet Commonly known as:  DIABETA     TAKE these medications   acetaminophen 325 MG tablet Commonly known as:  TYLENOL Take 2 tablets (650 mg total) by mouth every 4 (four) hours as needed (for pain scale < 4).   ibuprofen 600 MG tablet Commonly known as:  ADVIL,MOTRIN Take 1 tablet (600 mg total) by mouth every 6 (six) hours.   PNV PO Take 1 tablet by mouth daily.            Discharge Care Instructions        Start     Ordered   12/31/16 0000  acetaminophen (TYLENOL) 325 MG tablet  Every 4 hours PRN     12/31/16 0833   12/31/16 0000  ibuprofen (ADVIL,MOTRIN) 600 MG tablet  Every 6 hours     12/31/16 7902      Diet: carb modified diet  Activity: Advance as tolerated. Pelvic rest for 6 weeks.   Outpatient follow up:6 weeks Follow up Appt:Future Appointments Date Time Provider [place]  02/14/2017 8:00 AM Luvenia Redden, PA-C [facility]   Follow up visit: No Follow-up on file.  Postpartum contraception: Nexplanon  Newborn Data: Live born female  Birth Weight: 6 lb 5.9 oz (2890 g) APGAR: 9, 9  Baby Feeding: Bottle and  Breast Disposition:home with mother   12/31/2016 Koren Shiver, CNM

## 2016-12-31 NOTE — Discharge Summary (Signed)
OB Discharge Summary     Patient Name: Tricia Williams DOB: 08-30-1975 MRN: [id number]  Date of admission: 12/30/2016 Delivering MD: Katheren Shams   Date of discharge: 12/31/2016  Admitting diagnosis: 38 WEEKS CTX Intrauterine pregnancy: [redacted]w[redacted]d     Secondary diagnosis:  Active Problems:   Normal labor   Gestational diabetes mellitus (GDM) controlled on oral hypoglycemic drug   SVD (spontaneous vaginal delivery)  Additional problems: AMA     Discharge diagnosis: Term Pregnancy Delivered and GDM A2                                                                                                Post partum procedures:none  Augmentation: none  Complications: None  Hospital course:  Onset of Labor With Vaginal Delivery     41 y.o. yo [id number] at [redacted]w[redacted]d was admitted in Latent Labor on 12/30/2016. Patient had an uncomplicated labor course as follows:  Membrane Rupture Time/Date: 10:40 AM ,12/30/2016   Intrapartum Procedures: Episiotomy: None [1]                                         Lacerations:  None [1]  Patient had a delivery of a Viable infant. 12/30/2016  Information for the patient's newborn:  Brionne, Mertz [[id number]]  Delivery Method: Vaginal, Spontaneous Delivery (Filed from Delivery Summary)    Pateint had an uncomplicated postpartum course.  She is ambulating, tolerating a regular diet, passing flatus, and urinating well. Patient is discharged home in stable condition on 12/31/16.   Physical exam  Vitals:   12/30/16 1910 12/30/16 2230 12/31/16 0647 12/31/16 0725  BP: (!) 107/52 (!) 108/44 (!) 96/53 (!) 91/48  Pulse: (!) 59 (!) 55 (!) 53 (!) 48  Resp: 18 18 18 18   Temp: 99.1 F (37.3 C) 98.3 F (36.8 C) 97.8 F (36.6 C) 98 F (36.7 C)  TempSrc: Oral Oral Oral Oral  SpO2: 99% 99%    Weight:      Height:       General: alert, cooperative and no distress Lochia: appropriate Uterine Fundus: firm Incision: N/A DVT Evaluation: No evidence of  DVT seen on physical exam. No cords or calf tenderness. No significant calf/ankle edema. Labs: Lab Results  Component Value Date   WBC 12.4 (H) 12/30/2016   HGB 13.1 12/30/2016   HCT 37.9 12/30/2016   MCV 91.3 12/30/2016   PLT 191 12/30/2016   CMP Latest Ref Rng & Units 08/22/2016  Glucose 65 - 99 mg/dL 114(H)  BUN 6 - 24 mg/dL 10  Creatinine 0.57 - 1.00 mg/dL 0.56(L)  Sodium 134 - 144 mmol/L 138  Potassium 3.5 - 5.2 mmol/L 3.9  Chloride 96 - 106 mmol/L 105  CO2 18 - 29 mmol/L 20  Calcium 8.7 - 10.2 mg/dL 9.4  Total Protein 6.0 - 8.5 g/dL 6.5  Total Bilirubin 0.0 - 1.2 mg/dL <0.2  Alkaline Phos 39 - 117 IU/L 63  AST 0 - 40 IU/L 9  ALT 0 -  32 IU/L 11    Discharge instruction: per After Visit Summary and "Baby and Me Booklet".  After visit meds:  Allergies as of 12/31/2016   No Known Allergies     Medication List    STOP taking these medications   aspirin EC 81 MG tablet   glyBURIDE 2.5 MG tablet Commonly known as:  DIABETA     TAKE these medications   acetaminophen 325 MG tablet Commonly known as:  TYLENOL Take 2 tablets (650 mg total) by mouth every 4 (four) hours as needed (for pain scale < 4).   ibuprofen 600 MG tablet Commonly known as:  ADVIL,MOTRIN Take 1 tablet (600 mg total) by mouth every 6 (six) hours.   PNV PO Take 1 tablet by mouth daily.            Discharge Care Instructions        Start     Ordered   12/31/16 0000  acetaminophen (TYLENOL) 325 MG tablet  Every 4 hours PRN     12/31/16 0833   12/31/16 0000  ibuprofen (ADVIL,MOTRIN) 600 MG tablet  Every 6 hours     12/31/16 0833      Diet: routine diet  Activity: Advance as tolerated. Pelvic rest for 6 weeks.   Outpatient follow up:4 weeks Follow up Appt: Future Appointments Date Time Provider [place]  02/14/2017 8:00 AM Luvenia Redden, PA-C [facility]   Follow up Visit:No Follow-up on file.  Postpartum contraception: Nexplanon  Newborn Data: Live born female   Birth Weight: 6 lb 5.9 oz (2890 g) APGAR: 9, 9  Baby Feeding: Breast Disposition:home with mother   12/31/2016 Kathrene Alu, MD I personally assessed this pt and agree with above assessment.

## 2016-12-31 NOTE — Anesthesia Postprocedure Evaluation (Signed)
Anesthesia Post Note  Patient: Tricia Williams  Procedure(s) Performed: * No procedures listed *     Patient location during evaluation: Mother Baby Anesthesia Type: Epidural Level of consciousness: awake and alert and oriented Pain management: pain level controlled Vital Signs Assessment: post-procedure vital signs reviewed and stable Respiratory status: spontaneous breathing and nonlabored ventilation Cardiovascular status: stable Postop Assessment: no headache, patient able to bend at knees, no backache, no apparent nausea or vomiting, epidural receding and adequate PO intake Anesthetic complications: no    Last Vitals:  Vitals:   12/31/16 0647 12/31/16 0725  BP: (!) 96/53 (!) 91/48  Pulse: (!) 53 (!) 48  Resp: 18 18  Temp: 36.6 C 36.7 C  SpO2:      Last Pain:  Vitals:   12/31/16 0725  TempSrc: Oral  PainSc:    Pain Goal:                 Jabier Mutton

## 2017-01-01 ENCOUNTER — Ambulatory Visit: Payer: Self-pay

## 2017-01-01 NOTE — Lactation Note (Addendum)
This note was copied from a baby's chart. Lactation Consultation Note  Patient Name: Tricia Williams Date: 01/01/2017 Reason for consult: Follow-up assessment  Baby 57 hours old. Assisted by in-house Spanish interpreter Alex. Mom has been nursing baby for 10 when this LC entered the room. Baby on tip of left nipple in cradle position. Mom reports sore nipples and states that she is not sure if her milk is "coming in." Assisted mom with hand expression and mom has transitional milk flowing. Assisted mom with cross-cradle and compression of breast to achieve a deeper latch. Demonstrated how to break the seal when baby nursing in order to protect her nipple. Mom states that she had trouble with this earlier and this did make both her nipples sore. Baby able to achieve a deep latch on left breast with much better jaw excursion--pointed out to mom--and some swallows noted. Enc mom to move baby to right breast and see if she could achieve a deep latch in cross-cradle position. With assistance, baby maintained a deep latch with intermittent swallows. Enc maintaining a deep latch, using EBM on nipples and wearing comfort gels between feedings to improve sore nipples. Mom has hand pump in case she needs to pump for a feeding as well. Mom aware of OP/BFSG and [place] phone line assistance after D/C.   Maternal Data    Feeding Feeding Type: Breast Fed  LATCH Score Latch: Grasps breast easily, tongue down, lips flanged, rhythmical sucking.  Audible Swallowing: A few with stimulation  Type of Nipple: Everted at rest and after stimulation  Comfort (Breast/Nipple): Filling, red/small blisters or bruises, mild/mod discomfort  Hold (Positioning): Assistance needed to correctly position infant at breast and maintain latch.  LATCH Score: 7  Interventions Interventions: Assisted with latch;Skin to skin;Hand express;Breast compression;Adjust position;Position options  Lactation Tools  Discussed/Used Tools: Pump;Comfort gels Breast pump type: Manual   Consult Status Consult Status: PRN    Andres Labrum 01/01/2017, 8:40 AM

## 2017-01-01 NOTE — Lactation Note (Addendum)
This note was copied from a baby's chart. Lactation Consultation Note  Patient Name: Tricia Williams [id number] Date: 01/01/2017 Reason for consult: Follow-up assessment  Baby 68 hours old. Assisted by Spanish interpreter Alex. Mom only able to hand express 1 ml, so asking how to supplement with formula until her EBM volume increases. Assisted parents to syringe and finger-feed 1 ml of EBM and 9 ml of formula and baby tolerated well. Enc mom to continue putting baby to breast with cues, then supplement with EBM/FO according to guidelines--which were given in Spanish with review. Enc mom to use manual expression afterwards and reviewed EBM storage guidelines, referring parents to "Mother and Baby Care" booklet with review. Mom reports that she is not active with [facility] but intends to make an appointment. Mom aware of rental program through [employer] at [facility].   Mom aware of OP/BFSG and [place] phone line assistance after D/C. Discussed assessment and interventions with Gwinda Passe, RN.    Maternal Data    Feeding Feeding Type: Formula Length of feed:  (Assessed the first 10 minutes of BF on right side. )  LATCH Score Latch: Grasps breast easily, tongue down, lips flanged, rhythmical sucking.  Audible Swallowing: Spontaneous and intermittent  Type of Nipple: Everted at rest and after stimulation  Comfort (Breast/Nipple): Filling, red/small blisters or bruises, mild/mod discomfort  Hold (Positioning): Assistance needed to correctly position infant at breast and maintain latch.  LATCH Score: 8  Interventions Interventions: Assisted with latch;Skin to skin;Hand express;Breast compression;Adjust position;Position options  Lactation Tools Discussed/Used Tools: Pump;Comfort gels Breast pump type: Manual WIC Program: No   Consult Status Consult Status: Complete    Andres Labrum 01/01/2017, 11:36 AM

## 2017-02-14 ENCOUNTER — Ambulatory Visit: Payer: Self-pay | Admitting: Medical

## 2017-03-11 ENCOUNTER — Ambulatory Visit: Payer: Self-pay | Admitting: Physician Assistant

## 2017-03-11 ENCOUNTER — Encounter: Payer: Self-pay | Admitting: Family Medicine

## 2017-03-11 ENCOUNTER — Other Ambulatory Visit: Payer: Self-pay

## 2017-03-11 ENCOUNTER — Ambulatory Visit: Payer: Self-pay | Admitting: Family Medicine

## 2017-03-11 VITALS — BP 128/88 | HR 112 | Temp 98.9°F | Resp 16 | Ht 61.42 in | Wt 133.0 lb

## 2017-03-11 DIAGNOSIS — J069 Acute upper respiratory infection, unspecified: Secondary | ICD-10-CM

## 2017-03-11 DIAGNOSIS — O24410 Gestational diabetes mellitus in pregnancy, diet controlled: Secondary | ICD-10-CM

## 2017-03-11 DIAGNOSIS — T17308A Unspecified foreign body in larynx causing other injury, initial encounter: Secondary | ICD-10-CM

## 2017-03-11 DIAGNOSIS — J0100 Acute maxillary sinusitis, unspecified: Secondary | ICD-10-CM

## 2017-03-11 LAB — POCT CBC
GRANULOCYTE PERCENT: 65.3 % (ref 37–80)
HEMATOCRIT: 39.4 % (ref 37.7–47.9)
HEMOGLOBIN: 13.3 g/dL (ref 12.2–16.2)
LYMPH, POC: 2 (ref 0.6–3.4)
MCH, POC: 30.3 pg (ref 27–31.2)
MCHC: 33.9 g/dL (ref 31.8–35.4)
MCV: 89.4 fL (ref 80–97)
MID (cbc): 0.2 (ref 0–0.9)
MPV: 6.8 fL (ref 0–99.8)
PLATELET COUNT, POC: 236 10*3/uL (ref 142–424)
POC GRANULOCYTE: 4.2 (ref 2–6.9)
POC LYMPH %: 31.3 % (ref 10–50)
POC MID %: 3.4 %M (ref 0–12)
RBC: 4.41 M/uL (ref 4.04–5.48)
RDW, POC: 13.3 %
WBC: 6.5 10*3/uL (ref 4.6–10.2)

## 2017-03-11 LAB — GLUCOSE, POCT (MANUAL RESULT ENTRY): POC GLUCOSE: 114 mg/dL — AB (ref 70–99)

## 2017-03-11 MED ORDER — BENZONATATE 100 MG PO CAPS: 100.0000 mg | ORAL_CAPSULE | Freq: Three times a day (TID) | ORAL | 0 refills | Status: DC | PRN

## 2017-03-11 MED ORDER — IPRATROPIUM BROMIDE 0.03 % NA SOLN: 2.0000 | Freq: Two times a day (BID) | NASAL | 0 refills | Status: DC

## 2017-03-11 MED ORDER — AMOXICILLIN 500 MG PO TABS: 1000.0000 mg | ORAL_TABLET | Freq: Two times a day (BID) | ORAL | 0 refills | Status: DC

## 2017-03-11 MED ORDER — LORATADINE-PSEUDOEPHEDRINE ER 5-120 MG PO TB12: 1.0000 | ORAL_TABLET | Freq: Two times a day (BID) | ORAL | 0 refills | Status: DC

## 2017-03-11 MED ORDER — RANITIDINE HCL 150 MG PO TABS: 150.0000 mg | ORAL_TABLET | Freq: Two times a day (BID) | ORAL | 3 refills | Status: DC

## 2017-03-11 NOTE — Progress Notes (Signed)
Subjective:    Patient ID: Tricia Williams, female    DOB: March 21, 1976, 41 y.o.   MRN: [id number]  03/11/2017  Cough (with some sore throat x 4 days ) and Nasal Congestion (with drainage x 2 week )    HPI This 41 y.o. female presents for evaluation of choking and cough and nasal congestion.  Interpreter [phone] Onset two weeks, started choking with tortilla, meat, or vitamins.  No specific reason.   Also having flu symptoms in morning and at night with sneezing.  Coughing with sore throat onset four days ago. +sweats; +chills; no fever. +HA.  +congestion severe; +rhinorrhea. Onset of nasal congestion two weeks ago.  Long duration of symptoms.  No SOB.   74 old baby at home; breastfeeding.  Not getting out of the  house much. Dizziness .  No n/v/d.  Denies reflux, heartburn, indigestion.  Denies abdominal pain. No ear pain.   Decreased po intake due to choking and sickness and head congestion.   L ear pain.  Mild.   No abdominal pain.   Ibuprofen at home.   Gestational diabetes with recent pregnancy; has not checked sugar with recent illness. Woke up yesterday choking with own saliva.  No problems drinking liquids.   This morning, could barely talk and very hoarse.  Yesterday, lower lip was swollen yesterday. Has lost three pounds in the past few weeks.  Only gained 3 pounds during pregnancy.  Weight prior to pregnancy of 143.  At six week postpartum check, weighed 136 pounds.  Now 133 and wants to know if there is concern about weight loss.   Husband has googled choking and worried about a tumor.  Pt hesitant to go to GI due to expense of consultation and any procedure.     BP Readings from Last 3 Encounters:  03/11/17 128/88  12/31/16 (!) 91/48  12/29/16 107/61   Wt Readings from Last 3 Encounters:  03/11/17 133 lb (60.3 kg)  12/30/16 146 lb (66.2 kg)  12/29/16 146 lb 12.8 oz (66.6 kg)   Immunization History  Administered Date(s) Administered  .  Influenza Split 01/09/2017  . Influenza,inj,Quad PF,6+ Mos 12/29/2016  . Influenza-Unspecified 07/07/2016  . Tdap 10/13/2016    Review of Systems  Constitutional: Negative for chills, diaphoresis, fatigue and fever.  HENT: Positive for congestion, postnasal drip, rhinorrhea, sinus pressure, sinus pain, trouble swallowing and voice change. Negative for ear pain, sneezing and sore throat.   Respiratory: Positive for cough. Negative for shortness of breath and wheezing.   Cardiovascular: Negative for chest pain, palpitations and leg swelling.  Gastrointestinal: Negative for abdominal pain, constipation, diarrhea, nausea and vomiting.  Neurological: Positive for dizziness and headaches.    Past Medical History:  Diagnosis Date  . Gestational diabetes   . Medical history non-contributory    Past Surgical History:  Procedure Laterality Date  . EYE MUSCLE SURGERY     No Known Allergies Current Outpatient Medications on File Prior to Visit  Medication Sig Dispense Refill  . acetaminophen (TYLENOL) 325 MG tablet Take 2 tablets (650 mg total) by mouth every 4 (four) hours as needed (for pain scale < 4). 30 tablet 0  . ibuprofen (ADVIL,MOTRIN) 600 MG tablet Take 1 tablet (600 mg total) by mouth every 6 (six) hours. 30 tablet 0   No current facility-administered medications on file prior to visit.    Social History   Socioeconomic History  . Marital status: Married    Spouse name: Not on file  .  Number of children: Not on file  . Years of education: Not on file  . Highest education level: Not on file  Social Needs  . Financial resource strain: Not on file  . Food insecurity - worry: Not on file  . Food insecurity - inability: Not on file  . Transportation needs - medical: Not on file  . Transportation needs - non-medical: Not on file  Occupational History  . Not on file  Tobacco Use  . Smoking status: Never Smoker  . Smokeless tobacco: Never Used  Substance and Sexual Activity    . Alcohol use: No  . Drug use: No  . Sexual activity: Yes  Other Topics Concern  . Not on file  Social History Narrative  . Not on file   Family History  Problem Relation Age of Onset  . Diabetes Father   . Hyperlipidemia Father   . Arthritis Mother        Objective:    BP 128/88   Pulse (!) 112   Temp 98.9 F (37.2 C) (Oral)   Resp 16   Ht 5' 1.42" (1.56 m)   Wt 133 lb (60.3 kg)   SpO2 99%   BMI 24.79 kg/m  Physical Exam  Constitutional: She is oriented to person, place, and time. She appears well-developed and well-nourished. No distress.  HENT:  Head: Normocephalic and atraumatic.  Right Ear: Tympanic membrane and ear canal normal.  Left Ear: Tympanic membrane, external ear and ear canal normal.  Nose: Mucosal edema and rhinorrhea present. Right sinus exhibits maxillary sinus tenderness. Right sinus exhibits no frontal sinus tenderness. Left sinus exhibits maxillary sinus tenderness. Left sinus exhibits no frontal sinus tenderness.  Mouth/Throat: Uvula is midline, oropharynx is clear and moist and mucous membranes are normal. Normal dentition.  Eyes: Conjunctivae are normal. Pupils are equal, round, and reactive to light.  Neck: Normal range of motion. Neck supple. No tracheal deviation present. No thyromegaly present.  Cardiovascular: Normal rate, regular rhythm and normal heart sounds. Exam reveals no gallop and no friction rub.  No murmur heard. Pulmonary/Chest: Effort normal and breath sounds normal. She has no wheezes. She has no rales.  Lymphadenopathy:    She has no cervical adenopathy.  Neurological: She is alert and oriented to person, place, and time.  Skin: Skin is warm and dry. She is not diaphoretic.  Psychiatric: She has a normal mood and affect. Her behavior is normal.  Nursing note and vitals reviewed.  No results found. Depression screen [facility] 2/9 03/11/2017 12/29/2016 12/22/2016 12/15/2016 12/08/2016  Decreased Interest 0 0 0 0 0  Down, Depressed,  Hopeless 0 0 0 0 0  PHQ - 2 Score 0 0 0 0 0  Altered sleeping - 0 0 0 0  Tired, decreased energy - 1 1 1 1   Change in appetite - 0 0 0 0  Feeling bad or failure about yourself  - 0 0 0 0  Trouble concentrating - 0 0 0 0  Moving slowly or fidgety/restless - 0 0 0 0  Suicidal thoughts - 0 0 0 0  PHQ-9 Score - 1 1 1 1   Some recent data might be hidden   Fall Risk  03/11/2017 12/29/2016 11/10/2016 10/17/2016 09/14/2016  Falls in the past year? No No No No No   Results for orders placed or performed in visit on 03/11/17  POCT CBC  Result Value Ref Range   WBC 6.5 4.6 - 10.2 K/uL   Lymph, poc 2.0 0.6 -  3.4   POC LYMPH PERCENT 31.3 10 - 50 %L   MID (cbc) 0.2 0 - 0.9   POC MID % 3.4 0 - 12 %M   POC Granulocyte 4.2 2 - 6.9   Granulocyte percent 65.3 37 - 80 %G   RBC 4.41 4.04 - 5.48 M/uL   Hemoglobin 13.3 12.2 - 16.2 g/dL   HCT, POC 39.4 37.7 - 47.9 %   MCV 89.4 80 - 97 fL   MCH, POC 30.3 27 - 31.2 pg   MCHC 33.9 31.8 - 35.4 g/dL   RDW, POC 13.3 %   Platelet Count, POC 236 142 - 424 K/uL   MPV 6.8 0 - 99.8 fL  POCT glucose (manual entry)  Result Value Ref Range   POC Glucose 114 (A) 70 - 99 mg/dl        Assessment & Plan:   1. Viral URI   2. Choking, initial encounter   3. Acute non-recurrent maxillary sinusitis   4. Diet controlled gestational diabetes mellitus (GDM), antepartum    -New onset sinusitis secondary to viral URI; rx for Amoxicillin, Tessalon Perles, Atrovent nasal spray, and Claritin D.  -New onset choking associated with viral URI.  No sore throat to suggest retropharyngeal abscess or peritonsillar abscess.  Benign exam.  Treat sinusitis and also treat underlying GERD.  If no improvement in two weeks, highly recommend GI consultation. To ED for acute worsening.   Orders Placed This Encounter  Procedures  . Ambulatory referral to Gastroenterology    Referral Priority:   Routine    Referral Type:   Consultation    Referral Reason:   Specialty Services Required     Number of Visits Requested:   1  . POCT CBC  . POCT glucose (manual entry)   Meds ordered this encounter  Medications  . amoxicillin (AMOXIL) 500 MG tablet    Sig: Take 2 tablets (1,000 mg total) by mouth 2 (two) times daily.    Dispense:  40 tablet    Refill:  0  . benzonatate (TESSALON) 100 MG capsule    Sig: Take 1-2 capsules (100-200 mg total) by mouth 3 (three) times daily as needed for cough.    Dispense:  40 capsule    Refill:  0  . ipratropium (ATROVENT) 0.03 % nasal spray    Sig: Place 2 sprays into both nostrils 2 (two) times daily.    Dispense:  30 mL    Refill:  0  . loratadine-pseudoephedrine (CLARITIN-D 12-HOUR) 5-120 MG tablet    Sig: Take 1 tablet by mouth 2 (two) times daily.    Dispense:  14 tablet    Refill:  0  . ranitidine (ZANTAC) 150 MG tablet    Sig: Take 1 tablet (150 mg total) by mouth 2 (two) times daily.    Dispense:  60 tablet    Refill:  3    No Follow-up on file.   Bastien Strawser Elayne Guerin, M.D. Primary Care at [facility] previously [facility] [place] [address] [place], [place]  [phone] phone [phone] fax

## 2017-03-11 NOTE — Patient Instructions (Addendum)
IF you received an x-ray today, you will receive an invoice from [facility]. Please contact [facility] at [phone] with questions or concerns regarding your invoice.   IF you received labwork today, you will receive an invoice from [facility]. Please contact [facility] at [phone] with questions or concerns regarding your invoice.   Our billing staff will not be able to assist you with questions regarding bills from these companies.  You will be contacted with the lab results as soon as they are available. The fastest way to get your results is to activate your My Chart account. Instructions are located on the last page of this paperwork. If you have not heard from [place] regarding the results in 2 weeks, please contact this office.      Sinusitis en los adultos (Sinusitis, Adult) La sinusitis es la inflamacin y Conservation officer, historic buildings en los senos paranasales. Los senos paranasales son espacios vacos en los huesos alrededor del rostro. Estos se encuentran en estos lugares:  Alrededor de los ojos.  En la mitad de la frente.  Detrs de Mudlogger.  En los pmulos. Los senos y las fosas nasales estn cubiertos de un lquido fibroso (mucosidad). Normalmente, la mucosidad drena a travs de los senos. Cuando los tejidos nasales se inflaman o hinchan, la mucosidad puede quedar atrapada o bloqueada, por lo que el aire no puede fluir por los senos paranasales. Esto permite que se desarrollen bacterias, virus y hongos, lo que produce infecciones. Mead, use o aplique los medicamentos de venta libre y los recetados solamente como se lo haya indicado el mdico. Estos pueden incluir aerosoles nasales.  Si le recetaron un antibitico, tmelo como se lo haya indicado el mdico. No deje de tomar los antibiticos aunque comience a Sports administrator. Hidrtese y humidifique los ambientes  Beba suficiente agua para Theatre manager la orina clara o de color amarillo  plido.  Use un humidificador de vapor fro para mantener la humedad de su hogar por encima del 50%.  Inhale vapor durante 10a 31minutos, de 3a 4veces al da o como se lo haya indicado el mdico. Puede hacer esto en el bao con el vapor del agua caliente de la ducha.  Trate de no exponerse al aire fro o seco. Reposo  Descanse todo lo que pueda.  Duerma con la cabeza elevada.  Asegrese de dormir lo suficiente cada noche. Instrucciones generales  Pngase un pao caliente y hmedo en el rostro 3o 4veces al da, o como se lo haya indicado su mdico. Esto ayuda a Building services engineer.  Lave sus manos frecuentemente con agua y [place]. Use un desinfectante para manos si no dispone de [place] y [place].  No fume. Evite estar cerca de personas que fuman (fumador pasivo).  Concurra a todas las visitas de control como se lo haya indicado el mdico. Esto es importante. SOLICITE AYUDA SI:  Tiene fiebre.  Los sntomas empeoran.  Los sntomas no mejoran en el perodo de 10das. SOLICITE AYUDA DE INMEDIATO SI:  Siente un dolor de cabeza muy intenso.  No puede dejar de vomitar.  Tiene dolor o hinchazn en la zona del rostro o los ojos.  Tiene dificultad para ver.  Se siente confundido.  Tiene el cuello rgido.  Tiene dificultad para respirar. Esta informacin no tiene Marine scientist el consejo del mdico. Asegrese de hacerle al mdico cualquier pregunta que tenga. Document Released: 12/21/2011 Document Revised: 07/20/2015 Document Reviewed: 01/21/2015 Elsevier Interactive Patient Education  2018 Elsevier Inc.  

## 2017-03-13 ENCOUNTER — Telehealth: Payer: Self-pay

## 2017-03-13 NOTE — Telephone Encounter (Signed)
Please advise 

## 2017-03-13 NOTE — Telephone Encounter (Signed)
Copied from [place]. Topic: Inquiry >> Mar 13, 2017  3:01 PM Conception Chancy, NT wrote: Reason for CRM: patient states she was seen last week by Reginia Forts, she put her on ipratropium and amoxicillin, she went to get [facility] today and they state it is affecting her breast feeding. She did not want to speak with nurse triage she would like Dr. Tamala Julian to give her a call

## 2017-03-14 NOTE — Telephone Encounter (Signed)
Call --- I prescribed her Amoxicillin, Ipratropium nasal spray, Claritin-D, and Tessalon Perles at her visit. All of these medications are fine with breastfeeding.  Is she having a problem with breastfeeding?

## 2017-03-14 NOTE — Telephone Encounter (Signed)
Pt states since she has been taking the medication ( nasal spray and Amoxicillin ) she has not been producing any milk, she states the doctor at the [facility] office told her to call and see if she could get something different. She was not having any problems before the medication.

## 2017-03-14 NOTE — Telephone Encounter (Signed)
Please advise 

## 2017-03-17 NOTE — Telephone Encounter (Signed)
Called pt and informed her of Dr. Tamala Julian recommendations and she verbalized understanding.

## 2017-03-17 NOTE — Telephone Encounter (Signed)
Call --- recommend patient stopping Claritin D; continue Amoxicillin and atrovent nasal spray.  She can continue [employer] for cough.  The Claritin D would be the medication that would be decreasing milk production.  Has she been taking Claritin D/Loratadine-D?

## 2017-04-13 ENCOUNTER — Encounter: Payer: Self-pay | Admitting: Family Medicine

## 2017-08-31 ENCOUNTER — Encounter: Payer: Self-pay | Admitting: Family Medicine

## 2017-10-12 IMAGING — US US FETAL BPP W/ NON-STRESS
1 series · 13 of 14 positions shown · non-contrast
Comparison: none

[Series 1: us fetal bpp w/nonstress · 14 acquisitions, 13 frames shown]
[im 1/14]
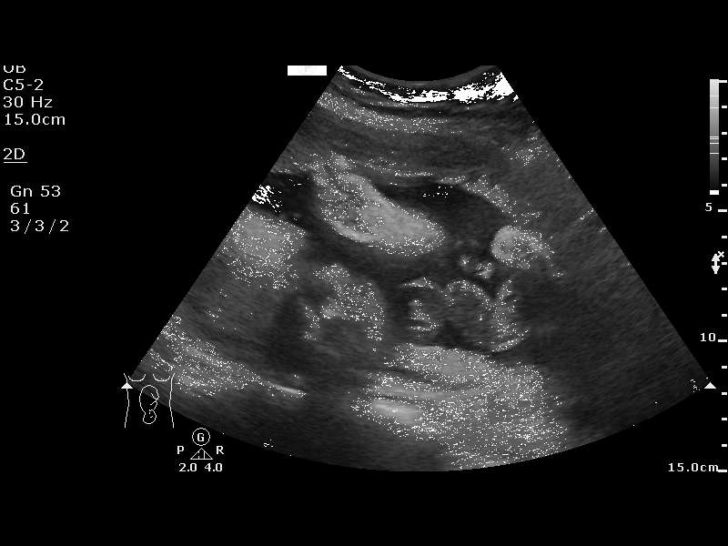
[im 2/14]
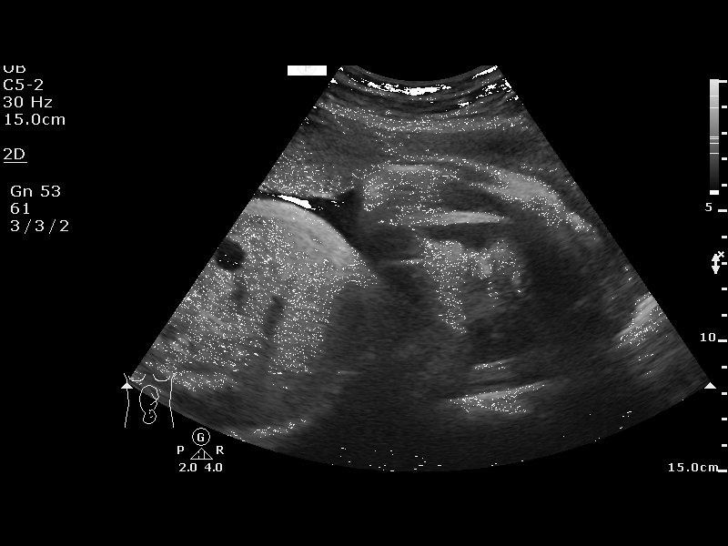
[im 3/14]
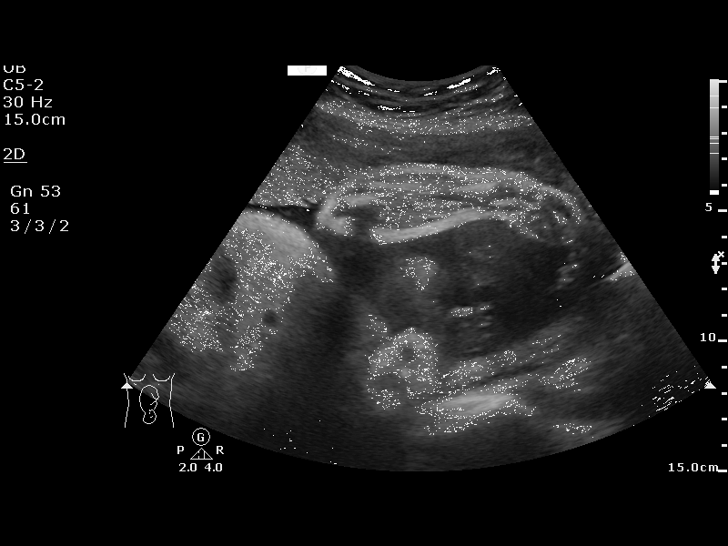
[im 4/14]
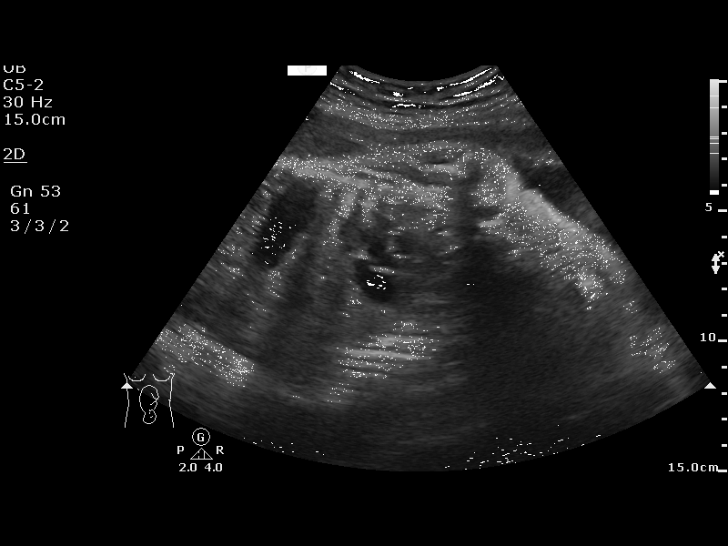
[im 5/14]
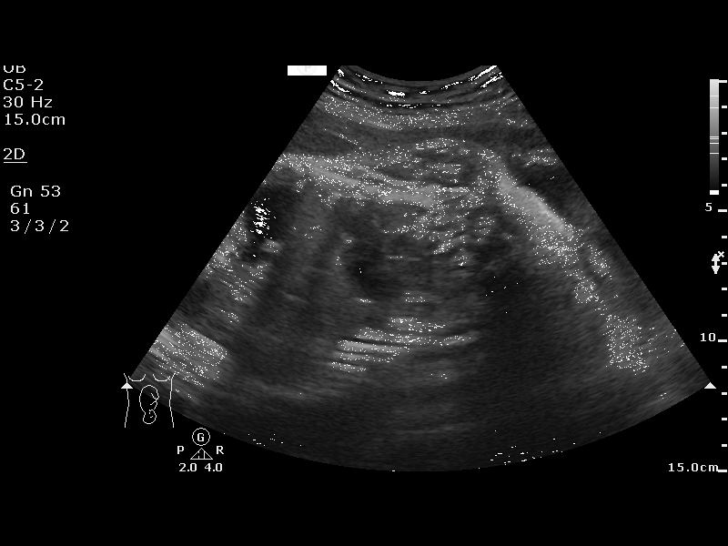
[im 6/14]
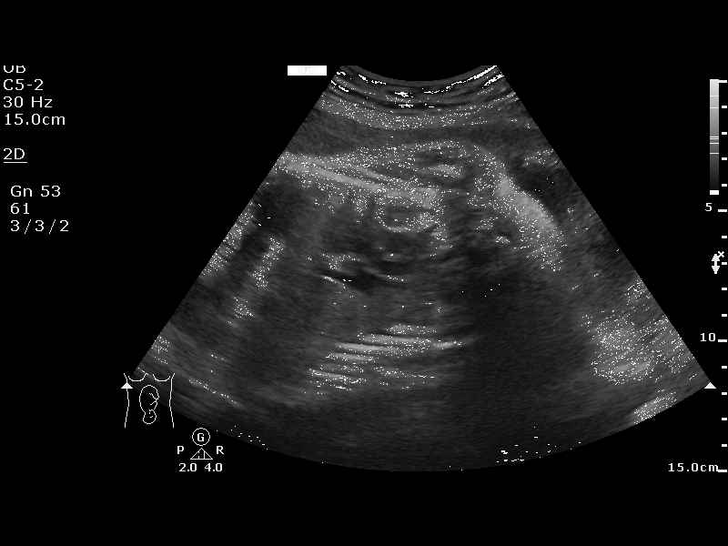
[im 8/14]
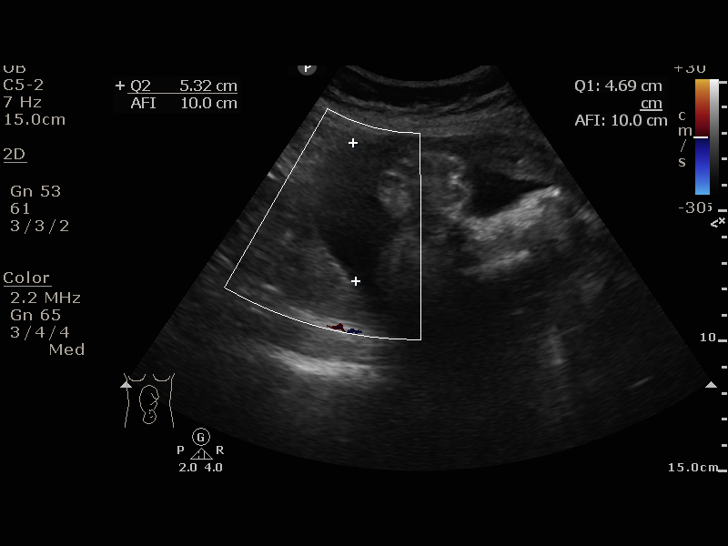
[im 9/14]
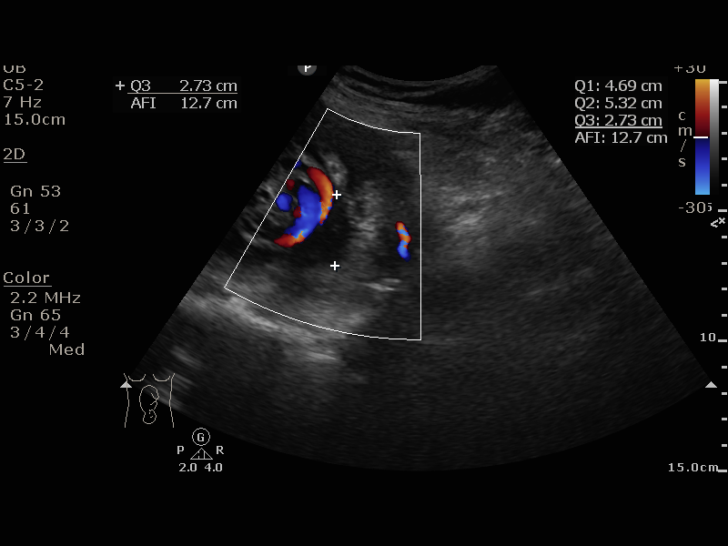
[im 10/14]
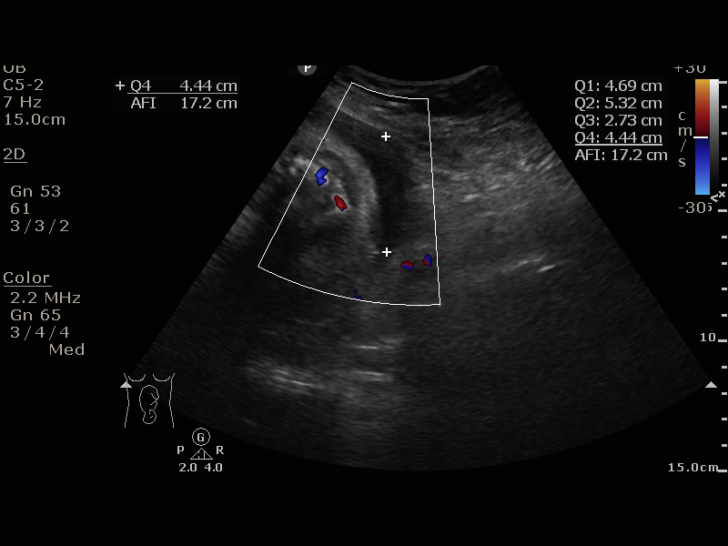
[im 11/14]
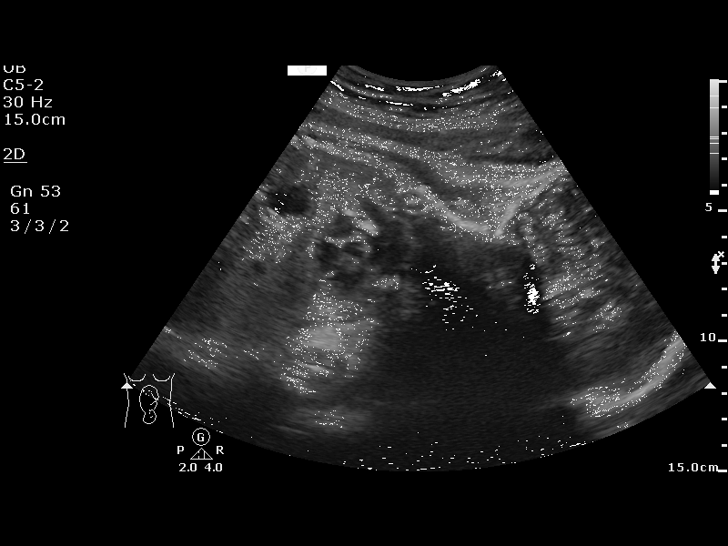
[im 12/14]
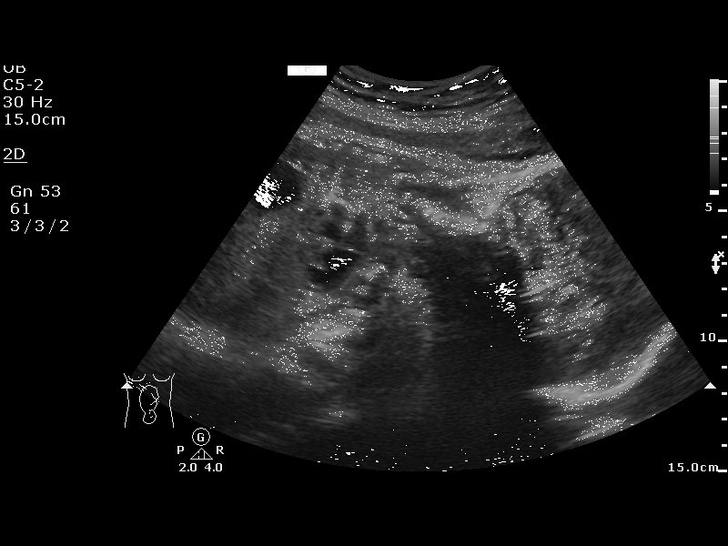
[im 13/14]
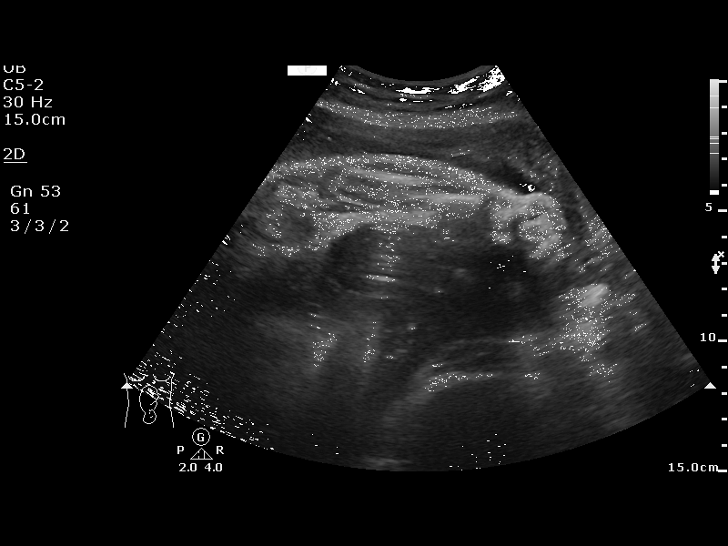
[im 14/14]
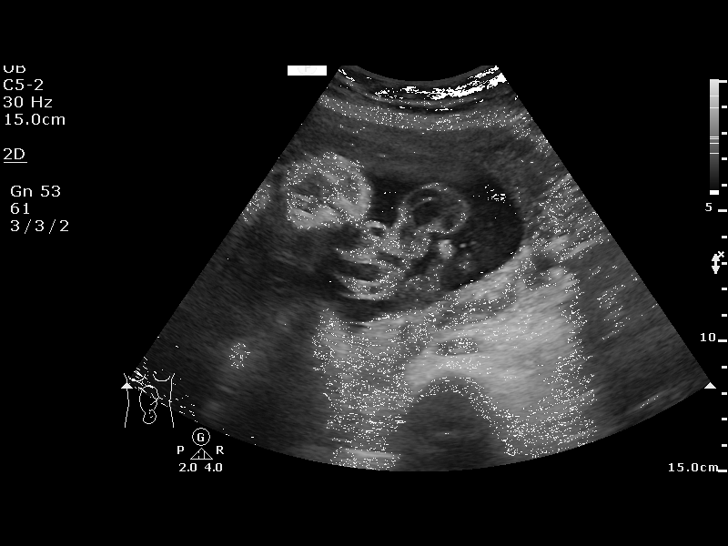

[13 of 14 positions shown; findings below may reference images not displayed]

[facility]

OB/Gyn Clinic
Women's
[REDACTED]

1  US FETAL BPP W/NONSTRESS                    76818.4

1  OYUKI MAY             [id number]      [phone]     [id number]
Service(s) Provided

Indications

34 weeks gestation of pregnancy
Gestational diabetes in pregnancy,
unspecified control
OB History

Blood Type:            Height:  5'1"   Weight (lb):  142       BMI:
Gravidity:    4         Term:   1
Living:       3
Fetal Evaluation

Num Of Fetuses:     1
Preg. Location:     Intrauterine
Cardiac Activity:   Observed
Presentation:       Cephalic

Amniotic Fluid
AFI FV:      Subjectively within normal limits

AFI Sum(cm)     %Tile       Largest Pocket(cm)
17.18           63
RUQ(cm)       RLQ(cm)       LUQ(cm)        LLQ(cm)
4.69
Biophysical Evaluation

Amniotic F.V:   Pocket => 2 cm two         F. Tone:        Observed
planes
F. Movement:    Observed                   N.S.T:          Reactive
F. Breathing:   Observed                   Score:          [DATE]
Gestational Age

LMP:           34w 4d        Date:  04/02/16                 EDD:   01/07/17
Best:          34w 4d     Det. By:  LMP  (04/02/16)          EDD:   01/07/17
Comments

US for growth in 1 week
Impression

Normal amniotic fluid volume. BPP [DATE].
Recommendations

Continue recommended antenatal testing.

## 2017-10-20 IMAGING — US US MFM OB FOLLOW-UP
1 series · 14 of 28 positions shown · non-contrast
Comparison: none

[Series 1: us mfm ob follow-up · 34 acquisitions, 14 frames shown]
[im 2/34]
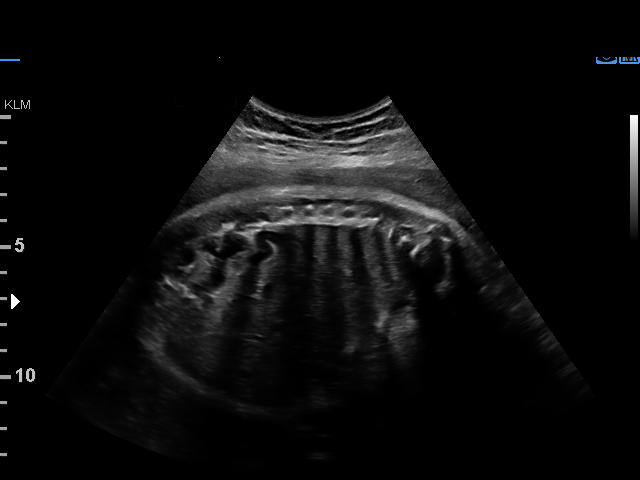
[im 4/34]
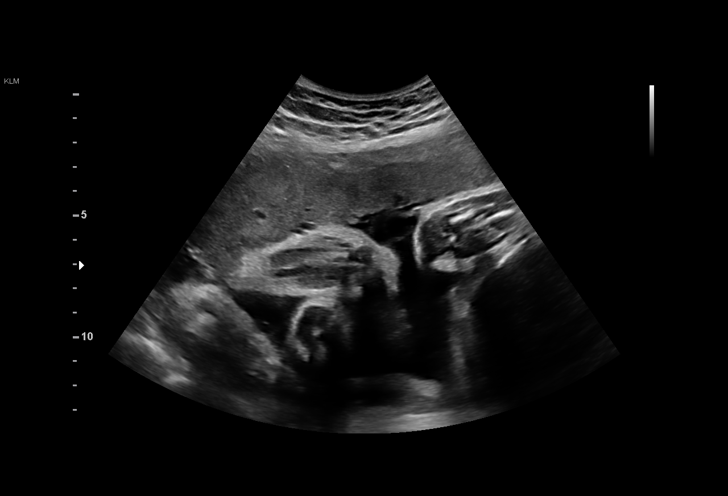
[im 7/34]
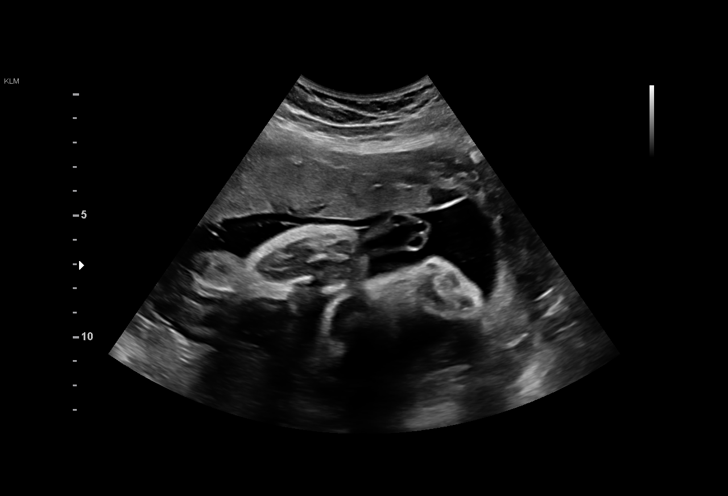
[im 9/34]
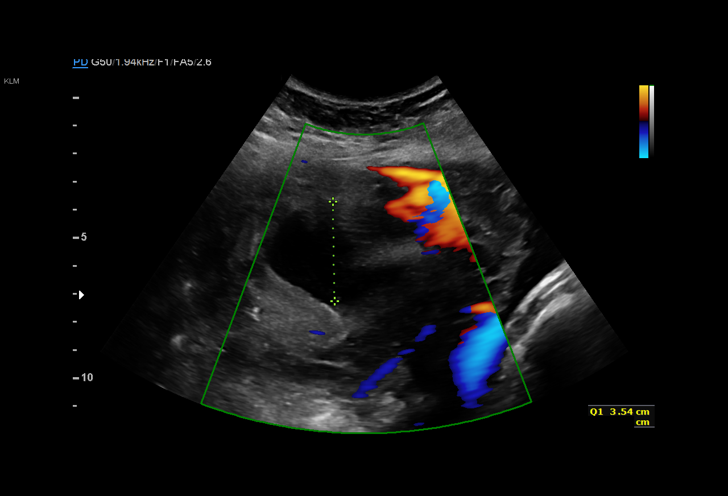
[im 12/34]
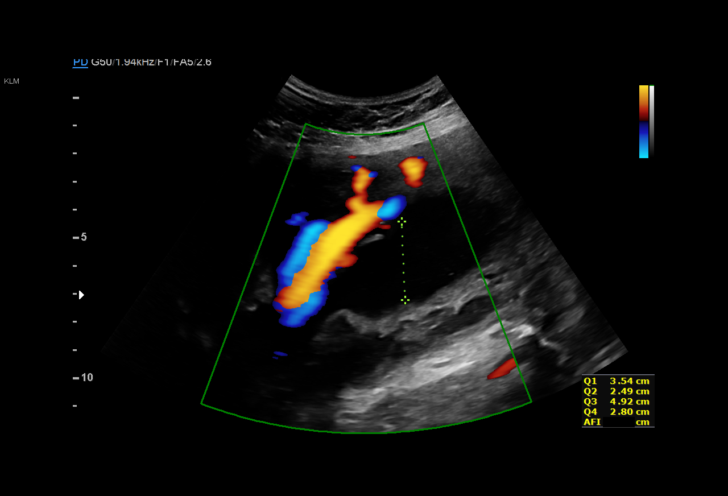
[im 14/34]
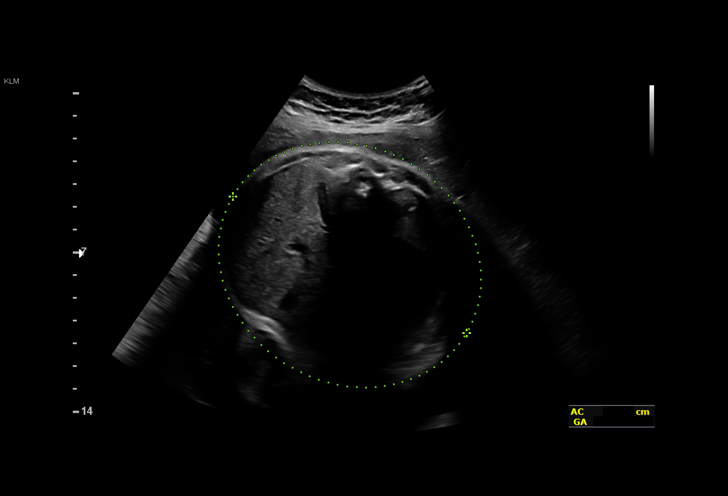
[im 16/34]
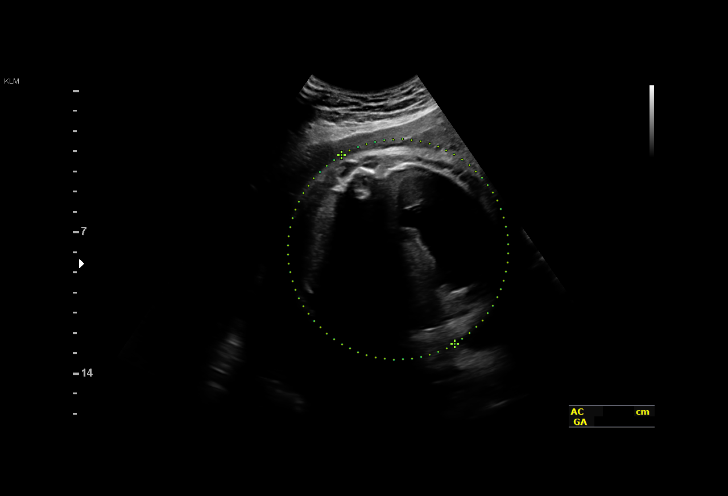
[im 19/34]
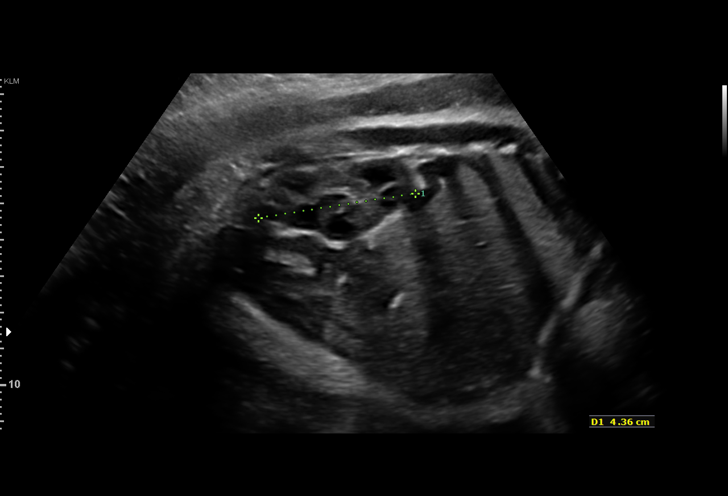
[im 21/34]
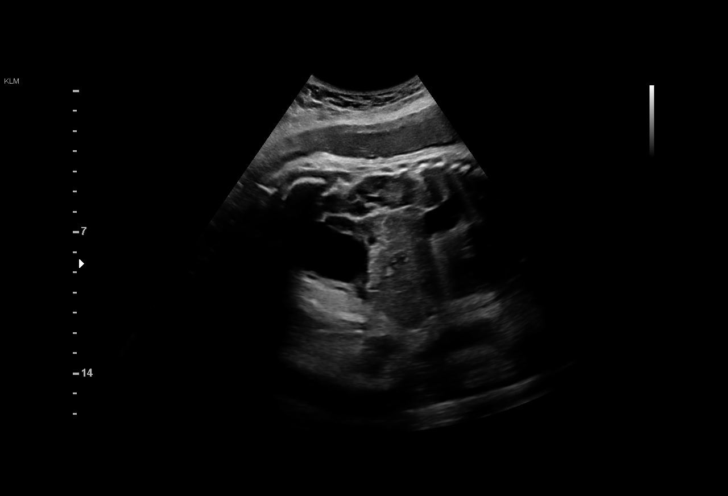
[im 24/34]
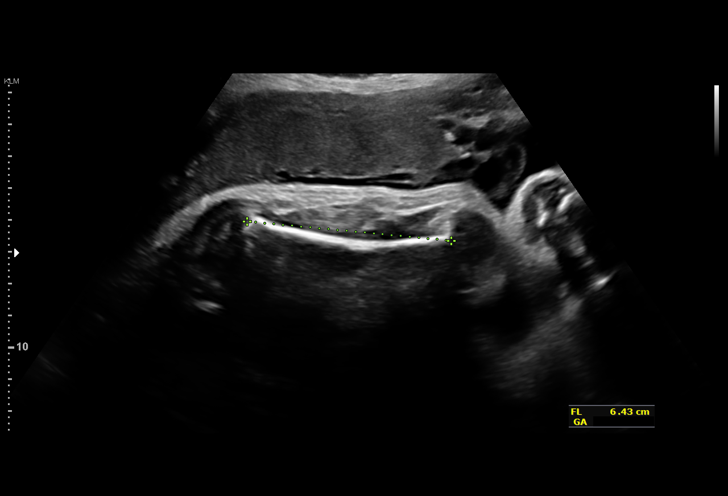
[im 26/34]
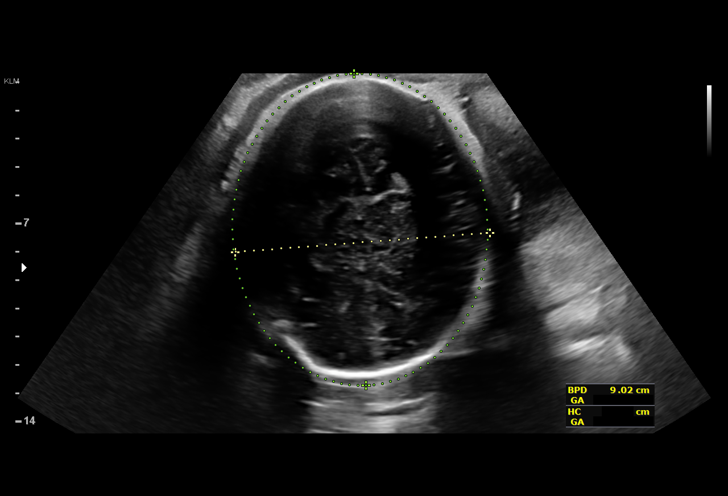
[im 29/34]
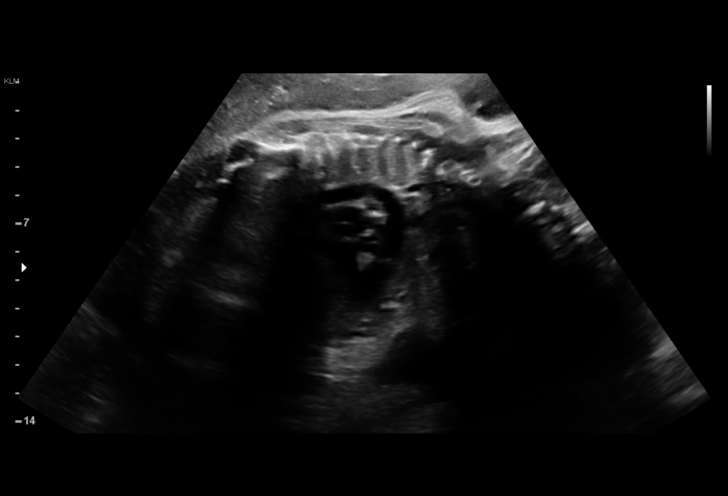
[im 31/34]
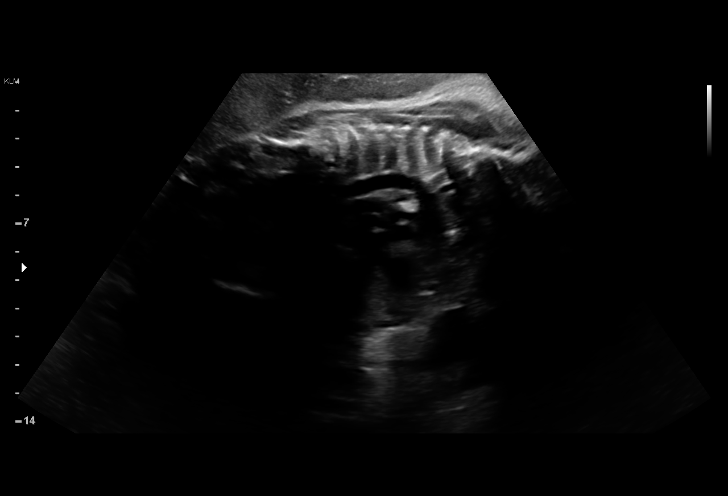
[im 34/34]
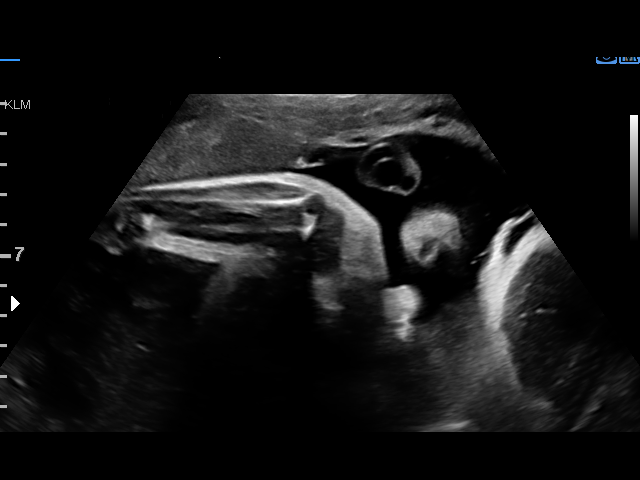

[14 of 28 positions shown; findings below may reference images not displayed]

[facility]

OB/Gyn Clinic

1  KLEVER JUMPER              [id number]      [id number]     [id number]
2  MARIA ISABEL HAROON             [id number]      [id number]     [id number]
Indications

35 weeks gestation of pregnancy
Gestational diabetes in pregnancy,
unspecified control
OB History

Blood Type:            Height:  5'1"   Weight (lb):  142       BMI:
Gravidity:    4         Term:   1
Living:       3
Fetal Evaluation

Num Of Fetuses:     1
Fetal Heart         149
Rate(bpm):
Cardiac Activity:   Observed
Presentation:       Cephalic
Placenta:           Anterior, above cervical os
P. Cord Insertion:  Previously Visualized

Amniotic Fluid
AFI FV:      Subjectively within normal limits

AFI Sum(cm)     %Tile       Largest Pocket(cm)
13.75           49
RUQ(cm)       RLQ(cm)       LUQ(cm)        LLQ(cm)
3.54
Biophysical Evaluation

Amniotic F.V:   Within normal limits       F. Tone:        Observed
F. Movement:    Observed                   Score:          [DATE]
F. Breathing:   Observed
Biometry

BPD:        90  mm     G. Age:  36w 4d         76  %    CI:        80.56   %    70 - 86
FL/HC:      20.5   %    20.1 -
HC:      316.7  mm     G. Age:  35w 4d         18  %    HC/AC:      0.92        0.93 -
AC:      345.4  mm     G. Age:  38w 3d       > 97  %    FL/BPD:     72.1   %    71 - 87
FL:       64.9  mm     G. Age:  33w 3d          5  %    FL/AC:      18.8   %    20 - 24

Est. FW:    6186  gm    6 lb 11 oz      81  %
Gestational Age

LMP:           35w 5d        Date:  04/02/16                 EDD:   01/07/17
U/S Today:     36w 0d                                        EDD:   01/05/17
Best:          35w 5d     Det. By:  LMP  (04/02/16)          EDD:   01/07/17
Anatomy

Cranium:               Appears normal         Aortic Arch:            Appears normal
Cavum:                 Previously seen        Ductal Arch:            Appears normal
Ventricles:            Previously seen        Diaphragm:              Previously seen
Choroid Plexus:        Previously seen        Stomach:                Appears normal, left
sided
Cerebellum:            Previously seen        Abdomen:                Appears normal
Posterior Fossa:       Previously seen        Abdominal Wall:         Previously seen
Nuchal Fold:           Previously seen        Cord Vessels:           Previously seen
Face:                  Orbits and profile     Kidneys:                Appear normal
previously seen
Lips:                  Previously seen        Bladder:                Appears normal
Thoracic:              Appears normal         Spine:                  Previously seen
Heart:                 Previously seen        Upper Extremities:      Previously seen
RVOT:                  Previously seen        Lower Extremities:      Previously seen
LVOT:                  Previously seen

Other:  Male gender. Heels and 5th digit previously visualized.
Cervix Uterus Adnexa

Cervix
Not visualized (advanced GA >59wks)
Impression

SIUP at 35+5 weeks with GDM and accelerated fetal growth
Normal interval anatomy; anatomic survey complete
Normal amniotic fluid volume
Appropriate interval growth with EFW at the 81st percentile;
AC remains >97th percentile
BPP [DATE]
Recommendations

Recheck rate of fetal growth in 3 weeks if undelivered

## 2017-10-27 IMAGING — US US FETAL BPP W/ NON-STRESS
1 series · 12 of 12 positions shown · non-contrast
Comparison: none

[Series 1: us fetal bpp w/nonstress · 12 acquisitions, 12 frames shown]
[im 1/12]
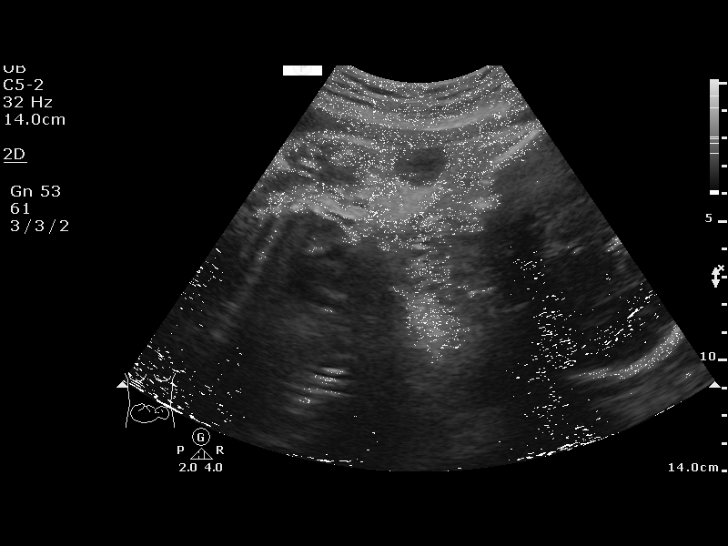
[im 2/12]
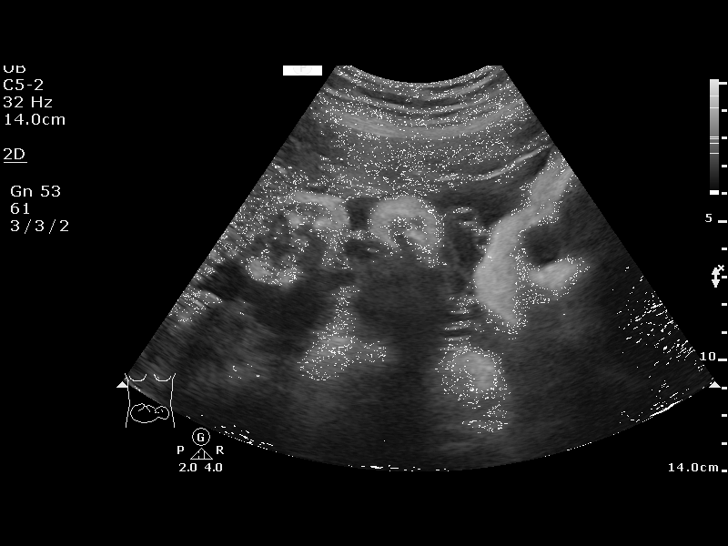
[im 3/12]
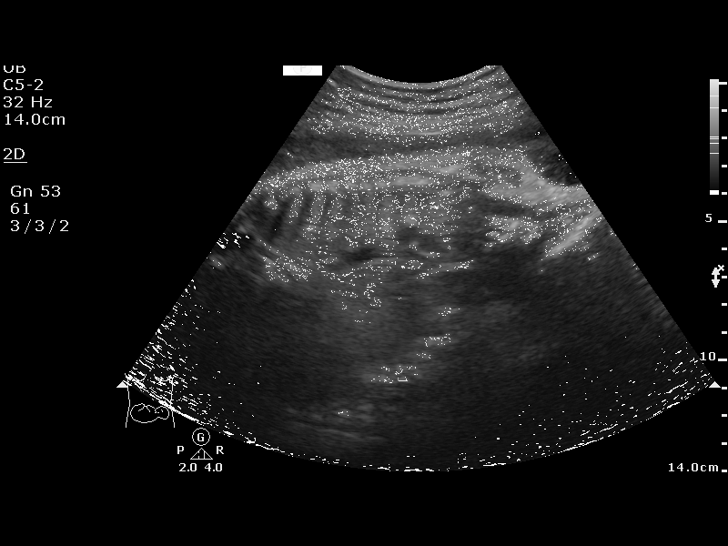
[im 4/12]
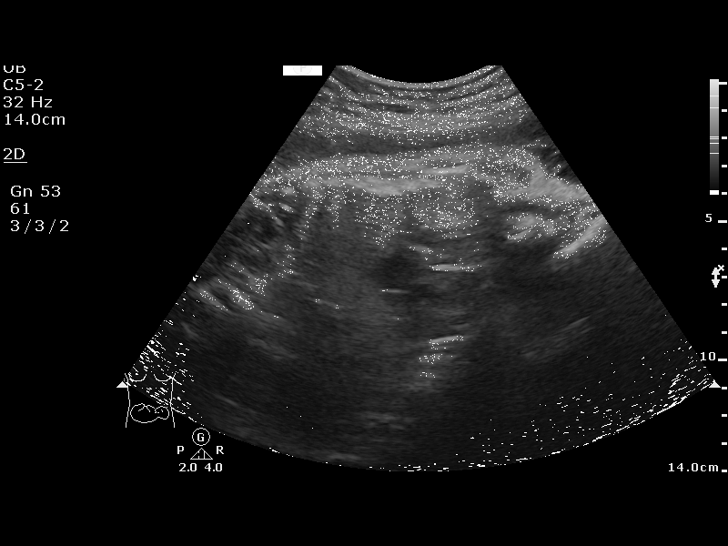
[im 5/12]
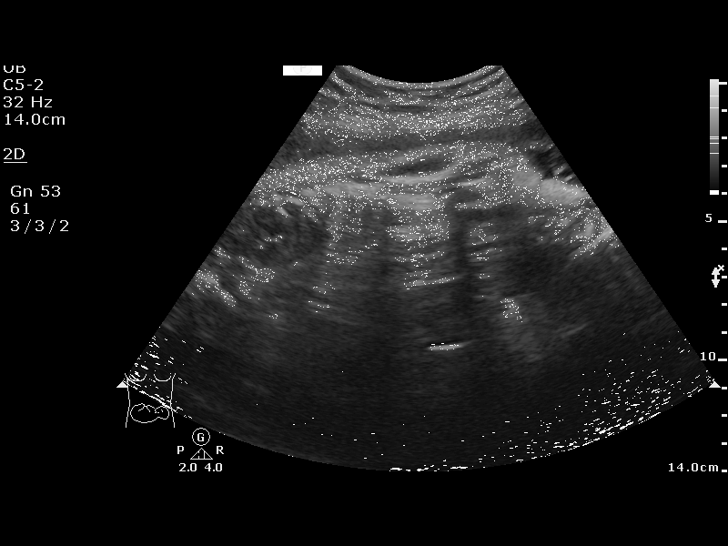
[im 6/12]
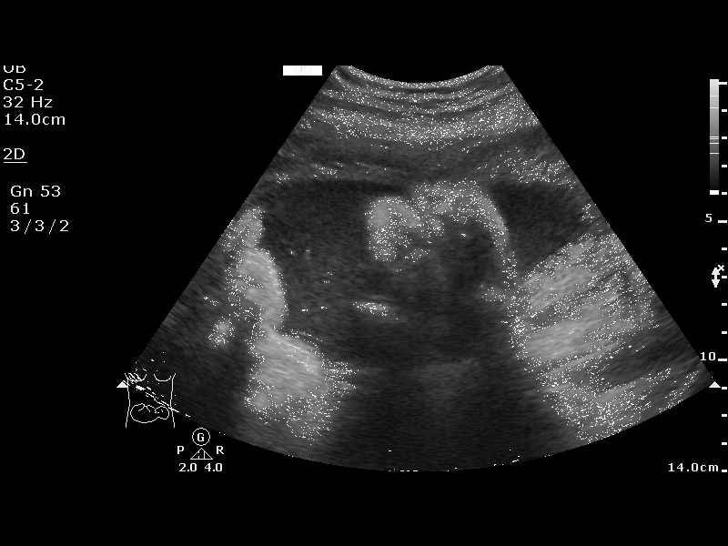
[im 7/12]
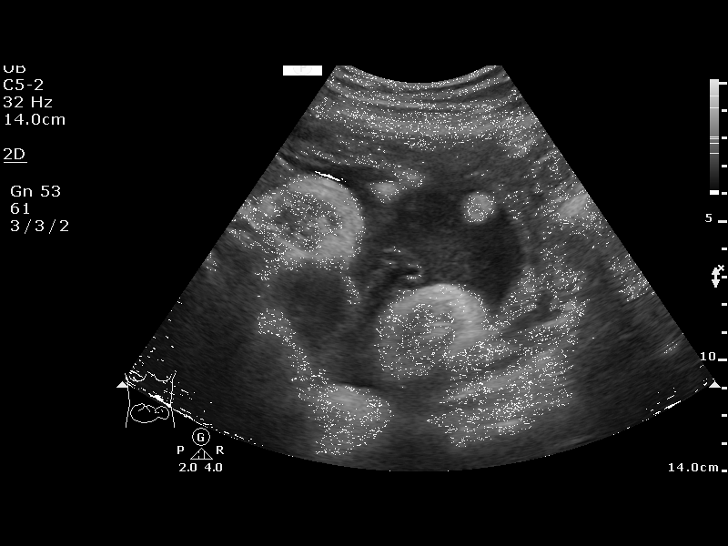
[im 8/12]
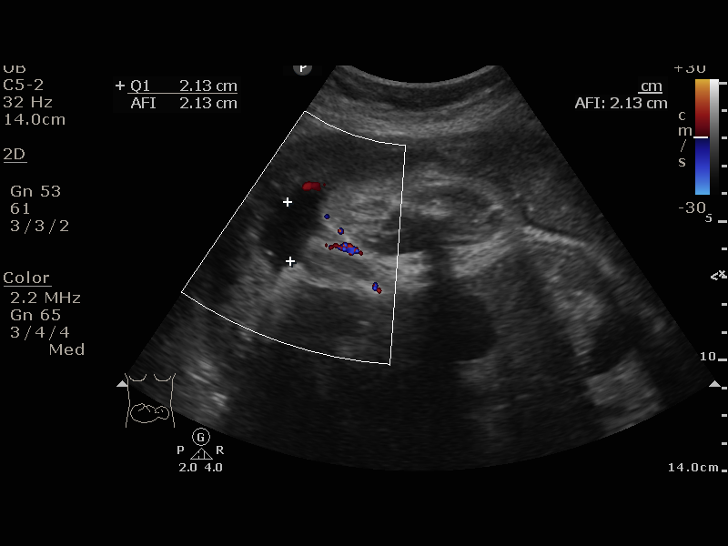
[im 9/12]
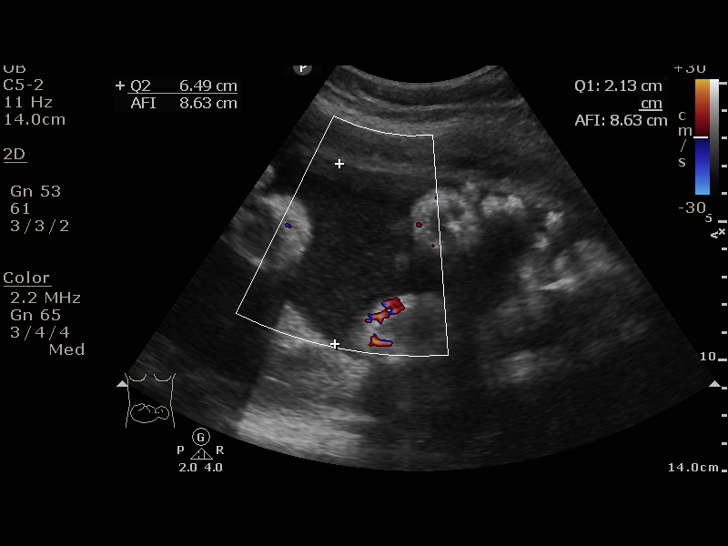
[im 10/12]
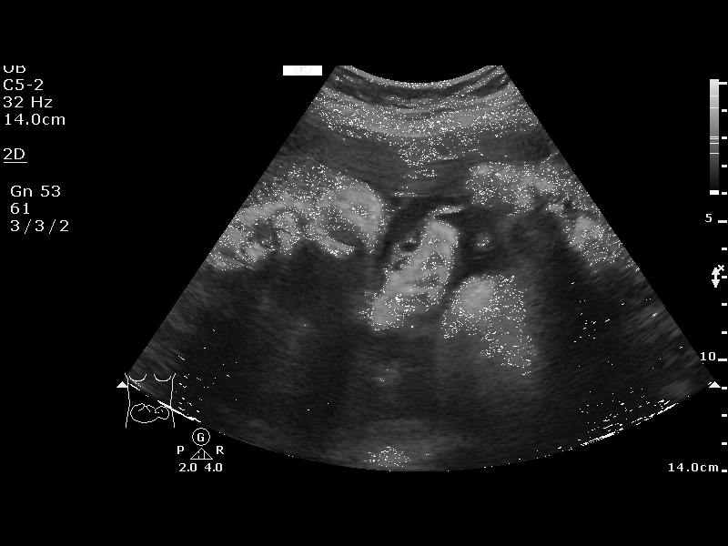
[im 11/12]
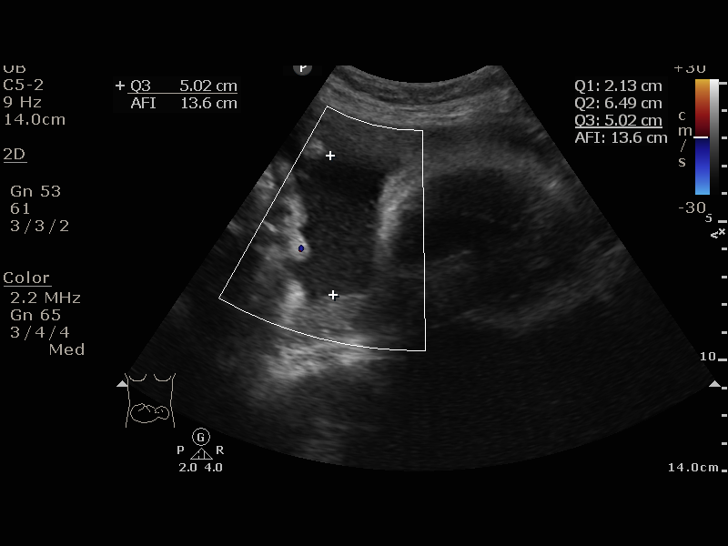
[im 12/12]
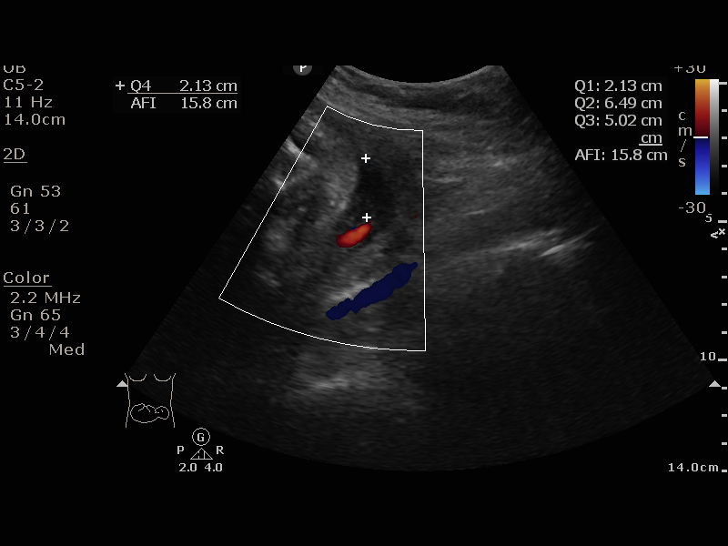

[12 of 12 positions shown; findings below may reference images not displayed]

[facility]

OB/Gyn Clinic
Women's
[REDACTED]

1  US FETAL BPP W/NONSTRESS                    76818.4

1  LULU TEWARI             [id number]      [phone]     [id number]
Service(s) Provided

Indications

36 weeks gestation of pregnancy
Gestational diabetes in pregnancy,
unspecified control
Advanced maternal age multigravida 35+,
third trimester
OB History

Blood Type:            Height:  5'1"   Weight (lb):  142       BMI:
Gravidity:    4         Term:   1
Living:       3
Fetal Evaluation

Num Of Fetuses:     1
Cardiac Activity:   Observed
Biophysical Evaluation

Amniotic F.V:   Pocket => 2 cm two         F. Tone:        Observed
planes
F. Movement:    Observed                   N.S.T:          Reactive
F. Breathing:   Observed                   Score:          [DATE]
Gestational Age

LMP:           36w 5d        Date:  04/02/16                 EDD:   01/07/17
Best:          36w 5d     Det. By:  LMP  (04/02/16)          EDD:   01/07/17
Impression

BPP [DATE]
Recommendations

Continue antenatal testing

## 2019-09-25 ENCOUNTER — Other Ambulatory Visit: Payer: Self-pay

## 2019-09-25 DIAGNOSIS — Z1231 Encounter for screening mammogram for malignant neoplasm of breast: Secondary | ICD-10-CM

## 2019-10-03 ENCOUNTER — Ambulatory Visit
Admission: RE | Admit: 2019-10-03 | Discharge: 2019-10-03 | Disposition: A | Discharge status: Home / Self Care | Payer: No Typology Code available for payment source | Source: Ambulatory Visit | Attending: Obstetrics and Gynecology | Admitting: Obstetrics and Gynecology

## 2019-10-03 ENCOUNTER — Other Ambulatory Visit: Payer: Self-pay

## 2019-10-03 ENCOUNTER — Ambulatory Visit: Payer: Self-pay | Admitting: *Deleted

## 2019-10-03 VITALS — BP 113/76 | Temp 98.2°F | Wt 156.3 lb

## 2019-10-03 DIAGNOSIS — Z1239 Encounter for other screening for malignant neoplasm of breast: Secondary | ICD-10-CM

## 2019-10-03 DIAGNOSIS — Z1231 Encounter for screening mammogram for malignant neoplasm of breast: Secondary | ICD-10-CM

## 2019-10-03 NOTE — Progress Notes (Signed)
[name] is a 44 y.o. female who presents to [facility] clinic today with no complaints. She is here for a CBE and screening mammogram.    Pap Smear: Pap smear not completed today. Last Pap smear was in April 2021 at the [facility]) clinic and was normal. Per patient has no history of an abnormal Pap smear. Last Pap smear result is not available in Epic. Pap smear results from 2016 are available in [facility] [place].   Physical exam: Breasts Breasts slightly asymmetrical with R > L but patient states she has never noticed a difference. No skin abnormalities bilateral breasts. No nipple retraction bilateral breasts. No nipple discharge bilateral breasts. Nipples appear to be cracked with a scant amount of blood but patient states this is from red food coloring that she put on her nipples to help wean her child from breastfeeding. Patient encouraged to apply coconut oil or lanolin to nipples to help them heal. No lymphadenopathy. No lumps palpated bilateral breasts. No complaints of pain or tenderness on exam. She states she recently stopped breastfeeding around 1 month ago.      Pelvic/Bimanual Pap is not indicated today.    Smoking History: Patient have never smoked.   Patient Navigation: Patient education provided. Access to services provided for patient through [place] program. Rudene Anda, [place] interpreter provided through [facility].   Breast and Cervical Cancer Risk Assessment: Patient does not have a family history of breast cancer, known genetic mutations, or radiation treatment to the chest before age 33. Patient does not have a history of cervical dysplasia, immunocompromised, or DES exposure in-utero.  Risk Assessment    Risk Scores      10/03/2019   Last edited by: Demetrius Revel, LPN   5-year risk: 0.4 %   Lifetime risk: 5.7 %          A: B[facility]CP exam without pap smear No complaints today.  P: Referred patient to the [facility] for a screening mammogram. Appointment scheduled October 03, 2019 at 9:40amon the mobile unit.  Vania Rea, RN, FNP student 10/03/2019 9:27 AM   Attestation of Supervision of Student:  I confirm that I have verified the information documented in the nurse practitioner student's note and that I have also personally reperformed the history, physical exam and all medical decision making activities.  I have verified that all services and findings are accurately documented in this student's note; and I agree with management and plan as outlined in the documentation. I have also made any necessary editorial changes.  [place], [place] for [employer], [place] [facility] 10/03/2019 10:36 AM

## 2019-10-03 NOTE — Patient Instructions (Addendum)
Informed Tricia Williams about breast self awareness. Patient did not need a Pap smear today due to last Pap smear was in April 2021 per patient. Let her know BCCCP will cover Pap smears every 3 years unless has a history of abnormal Pap smears. Referred patient to the [facility] for a screening mammogram. Appointment scheduled October 03, 2019 at 9:40amon the mobile unit. Patient aware of appointment and escorted to mobile unit. Let patient know the Breast Center will follow up with her within the next couple weeks with results of her mammogram by letter or phone. Verdis Frederickson I Williams verbalized understanding.  Vania Rea, RN, FNP student 9:37 AM

## 2020-11-13 ENCOUNTER — Other Ambulatory Visit: Payer: Self-pay | Admitting: Obstetrics and Gynecology

## 2020-11-17 ENCOUNTER — Other Ambulatory Visit: Payer: Self-pay | Admitting: Obstetrics and Gynecology

## 2020-11-17 DIAGNOSIS — Z1231 Encounter for screening mammogram for malignant neoplasm of breast: Secondary | ICD-10-CM

## 2020-12-10 ENCOUNTER — Other Ambulatory Visit: Payer: Self-pay | Admitting: Obstetrics and Gynecology

## 2020-12-10 DIAGNOSIS — Z1231 Encounter for screening mammogram for malignant neoplasm of breast: Secondary | ICD-10-CM

## 2020-12-23 ENCOUNTER — Telehealth: Payer: Self-pay | Admitting: Obstetrics and Gynecology

## 2020-12-23 NOTE — Telephone Encounter (Signed)
   Tricia Williams DOB: 1975-05-24 MRN: [id number]   RIDER WAIVER AND RELEASE OF LIABILITY  For purposes of improving physical access to our facilities, [facility] is pleased to partner with third parties to provide [place] patients or other authorized individuals the option of convenient, on-demand ground transportation services (the [place]") through use of the technology service that enables users to request on-demand ground transportation from independent third-party providers.  By opting to use and accept these [employer], I, the undersigned, hereby agree on behalf of myself, and on behalf of any minor child using the Government social research officer for whom I am the parent or legal guardian, as follows:  Government social research officer provided to me are provided by independent third-party transportation providers who are not [employer] or employees and who are unaffiliated with [employer]. [facility] is neither a transportation carrier nor a common or public carrier. [facility] [facility] has no control over the quality or safety of the transportation that occurs as a result of the [employer]. [place] [facility] cannot guarantee that any third-party transportation provider will complete any arranged transportation service. [place] makes no representation, warranty, or guarantee regarding the reliability, timeliness, quality, safety, suitability, or availability of any of the Transport Services or that they will be error free. I fully understand that traveling by vehicle involves risks and dangers of serious bodily injury, including permanent disability, paralysis, and death. I agree, on behalf of myself and on behalf of any minor child using the Transport Services for whom I am the parent or legal guardian, that the entire risk arising out of my use of the [employer] remains solely with me, to the maximum extent permitted under applicable law. The [employer] are  provided "as is" and "as available." [place] disclaims all representations and warranties, express, implied or statutory, not expressly set out in these terms, including the implied warranties of merchantability and fitness for a particular purpose. I hereby waive and release [place], its agents, employees, officers, directors, representatives, insurers, attorneys, assigns, successors, subsidiaries, and affiliates from any and all past, present, or future claims, demands, liabilities, actions, causes of action, or suits of any kind directly or indirectly arising from acceptance and use of the [employer]. I further waive and release [place] and its affiliates from all present and future liability and responsibility for any injury or death to persons or damages to property caused by or related to the use of the [employer]. I have read this Waiver and Release of Liability, and I understand the terms used in it and their legal significance. This Waiver is freely and voluntarily given with the understanding that my right (as well as the right of any minor child for whom I am the parent or legal guardian using the [employer]) to legal recourse against [place] in connection with the [employer] is knowingly surrendered in return for use of these services.   I attest that I read the consent document to [name], gave Tricia Williams the opportunity to ask questions and answered the questions asked (if any). I affirm that Tricia Williams then provided consent for she's participation in this program.     Legrand Pitts, Read to patient in [place]

## 2020-12-24 ENCOUNTER — Other Ambulatory Visit: Payer: Self-pay

## 2020-12-24 ENCOUNTER — Ambulatory Visit: Payer: Self-pay | Admitting: *Deleted

## 2020-12-24 ENCOUNTER — Ambulatory Visit
Admission: RE | Admit: 2020-12-24 | Discharge: 2020-12-24 | Disposition: A | Discharge status: Home / Self Care | Payer: No Typology Code available for payment source | Source: Ambulatory Visit | Attending: Obstetrics and Gynecology | Admitting: Obstetrics and Gynecology

## 2020-12-24 VITALS — BP 118/72 | Wt 164.4 lb

## 2020-12-24 DIAGNOSIS — Z1231 Encounter for screening mammogram for malignant neoplasm of breast: Secondary | ICD-10-CM

## 2020-12-24 DIAGNOSIS — Z1239 Encounter for other screening for malignant neoplasm of breast: Secondary | ICD-10-CM

## 2020-12-24 NOTE — Progress Notes (Signed)
[name] is a 45 y.o. female who presents to [facility] clinic today with no complaints.    Pap Smear: Pap smear not completed today. Last Pap smear was 08/02/2019 at the [facility] clinic and was normal per patient. Per patient has no history of an abnormal Pap smear. Last Pap smear result is not available in Epic. Previous Pap smear result from 07/02/2014 is in [place].   Physical exam: Breasts Breasts symmetrical. No skin abnormalities bilateral breasts. No nipple retraction bilateral breasts. No nipple discharge bilateral breasts. No lymphadenopathy. No lumps palpated bilateral breasts. No complaints of pain or tenderness on exam.  MS DIGITAL SCREENING TOMO BILATERAL  Result Date: 10/08/2019 CLINICAL DATA:  Screening. EXAM: DIGITAL SCREENING BILATERAL MAMMOGRAM WITH TOMO AND CAD COMPARISON:  None. ACR Breast Density Category c: The breast tissue is heterogeneously dense, which may obscure small masses FINDINGS: There are no findings suspicious for malignancy. Images were processed with CAD. IMPRESSION: No mammographic evidence of malignancy. A result letter of this screening mammogram will be mailed directly to the patient. RECOMMENDATION: Screening mammogram in one year. (Code:SM-B-01Y) BI-RADS CATEGORY  1: Negative. Electronically Signed   By: Everlean Alstrom M.D.   On: 10/08/2019 07:40        Pelvic/Bimanual Pap is not indicated today per BCCCP guidelines.   Smoking History: Patient has never smoked.   Patient Navigation: Patient education provided. Access to services provided for patient through [place] program. Spanish interpreter Rudene Anda from [facility] provided. Transportation provided to appointment and home by uber/lyft.   Breast and Cervical Cancer Risk Assessment: Patient does not have family history of breast cancer, known genetic mutations, or radiation treatment to the chest before age 86. Patient does not have history of cervical dysplasia,  immunocompromised, or DES exposure in-utero.  Risk Assessment     Risk Scores       12/24/2020 10/03/2019   Last edited by: Demetrius Revel, LPN McGill, Sherie Mamie Nick, LPN   5-year risk: 0.4 % 0.4 %   Lifetime risk: 5.6 % 5.7 %           A: BCCCP exam without pap smear No complaints.  P: Referred patient to the [facility] for a screening mammogram on mobile unit. Appointment scheduled Thursday, December 24, 2020 at 1450.  Loletta Parish, RN 12/24/2020 1:48 PM

## 2020-12-24 NOTE — Patient Instructions (Signed)
Explained breast self awareness with [name]. Patient did not need a Pap smear today due to last Pap smear was 08/02/2019 per patient. Let her know BCCCP will cover Pap smears every 3 years unless has a history of abnormal Pap smears. Referred patient to the [facility] for a screening mammogram on mobile unit. Appointment scheduled Thursday, December 24, 2020 at 1450. Patient escorted to the mobile unit following BCCCP appointment for her screening mammogram. Let patient know the Breast Center will follow up with her within the next couple weeks with results of her mammogram by letter or phone. Tricia Williams verbalized understanding.  Ovidio Steele, [name], RN 1:48 PM

## 2020-12-31 ENCOUNTER — Other Ambulatory Visit: Payer: Self-pay | Admitting: Obstetrics and Gynecology

## 2020-12-31 DIAGNOSIS — R928 Other abnormal and inconclusive findings on diagnostic imaging of breast: Secondary | ICD-10-CM

## 2021-01-18 ENCOUNTER — Other Ambulatory Visit: Payer: Self-pay

## 2021-01-18 ENCOUNTER — Ambulatory Visit
Admission: RE | Admit: 2021-01-18 | Discharge: 2021-01-18 | Disposition: A | Discharge status: Home / Self Care | Payer: No Typology Code available for payment source | Source: Ambulatory Visit | Attending: Obstetrics and Gynecology | Admitting: Obstetrics and Gynecology

## 2021-01-18 ENCOUNTER — Other Ambulatory Visit: Payer: Self-pay | Admitting: Obstetrics and Gynecology

## 2021-01-18 DIAGNOSIS — R928 Other abnormal and inconclusive findings on diagnostic imaging of breast: Secondary | ICD-10-CM

## 2021-01-18 DIAGNOSIS — N6310 Unspecified lump in the right breast, unspecified quadrant: Secondary | ICD-10-CM

## 2021-07-21 ENCOUNTER — Ambulatory Visit
Admission: RE | Admit: 2021-07-21 | Discharge: 2021-07-21 | Disposition: A | Discharge status: Home / Self Care | Payer: No Typology Code available for payment source | Source: Ambulatory Visit | Attending: Obstetrics and Gynecology | Admitting: Obstetrics and Gynecology

## 2021-07-21 DIAGNOSIS — N6310 Unspecified lump in the right breast, unspecified quadrant: Secondary | ICD-10-CM

## 2022-01-23 ENCOUNTER — Emergency Department (HOSPITAL_COMMUNITY)
Admission: EM | Admit: 2022-01-23 | Discharge: 2022-01-24 | Disposition: A | Discharge status: Home / Self Care | Payer: Self-pay | Attending: Emergency Medicine | Admitting: Emergency Medicine

## 2022-01-23 ENCOUNTER — Emergency Department (HOSPITAL_COMMUNITY): Payer: Self-pay

## 2022-01-23 DIAGNOSIS — Z79899 Other long term (current) drug therapy: Secondary | ICD-10-CM | POA: Insufficient documentation

## 2022-01-23 DIAGNOSIS — R739 Hyperglycemia, unspecified: Secondary | ICD-10-CM

## 2022-01-23 DIAGNOSIS — R079 Chest pain, unspecified: Secondary | ICD-10-CM | POA: Insufficient documentation

## 2022-01-23 DIAGNOSIS — R0602 Shortness of breath: Secondary | ICD-10-CM | POA: Insufficient documentation

## 2022-01-23 NOTE — ED Provider Triage Note (Signed)
Emergency Medicine Provider Triage Evaluation Note  Tricia Williams , a 46 y.o. female  was evaluated in triage.  Patient complaining a couple days shortness of breath.  Reports that today around 10 PM she started having chest pressure.  No ACS history.  Says that occasionally she feels like her throat is closing and she has difficulty catching her breath.  No history of DVT  Review of Systems  Positive:  Negative:   Physical Exam  BP (!) 153/82 (BP Location: Right Arm)   Pulse 96   Temp 98.8 F (37.1 C) (Oral)   Resp 18   SpO2 98%  Gen:   Awake, no distress   Resp:  Normal effort  MSK:   Moves extremities without difficulty  Other:  Reproducible epigastric tenderness.  RRR, occasionally tachycardic in the low 100s.  Resting comfortably, no reproducible chest wall tenderness  Medical Decision Making  Medically screening exam initiated at 11:49 PM.  Appropriate orders placed.  Tricia Williams was informed that the remainder of the evaluation will be completed by another provider, this initial triage assessment does not replace that evaluation, and the importance of remaining in the ED until their evaluation is complete.     Tricia Hammock, PA-C 01/23/22 2351

## 2022-01-24 ENCOUNTER — Other Ambulatory Visit: Payer: Self-pay

## 2022-01-24 ENCOUNTER — Encounter (HOSPITAL_COMMUNITY): Payer: Self-pay

## 2022-01-24 LAB — BASIC METABOLIC PANEL
Anion gap: 9 (ref 5–15)
BUN: 11 mg/dL (ref 6–20)
CO2: 22 mmol/L (ref 22–32)
Calcium: 8.9 mg/dL (ref 8.9–10.3)
Chloride: 105 mmol/L (ref 98–111)
Creatinine, Ser: 0.67 mg/dL (ref 0.44–1.00)
GFR, Estimated: >60 mL/min (ref >60)
Glucose, Bld: 221 mg/dL — ABNORMAL HIGH (ref 70–99)
Potassium: 3.9 mmol/L (ref 3.5–5.1)
Sodium: 136 mmol/L (ref 135–145)

## 2022-01-24 LAB — CBC WITH DIFFERENTIAL/PLATELET
Abs Immature Granulocytes: 0.03 10*3/uL (ref 0.00–0.07)
Basophils Absolute: 0.1 10*3/uL (ref 0.0–0.1)
Basophils Relative: 1 %
Eosinophils Absolute: 0.4 10*3/uL (ref 0.0–0.5)
Eosinophils Relative: 3 %
HCT: 38 % (ref 36.0–46.0)
Hemoglobin: 12.4 g/dL (ref 12.0–15.0)
Immature Granulocytes: 0 %
Lymphocytes Relative: 30 %
Lymphs Abs: 3.8 10*3/uL (ref 0.7–4.0)
MCH: 28.9 pg (ref 26.0–34.0)
MCHC: 32.6 g/dL (ref 30.0–36.0)
MCV: 88.6 fL (ref 80.0–100.0)
Monocytes Absolute: 0.7 10*3/uL (ref 0.1–1.0)
Monocytes Relative: 6 %
Neutro Abs: 7.8 10*3/uL — ABNORMAL HIGH (ref 1.7–7.7)
Neutrophils Relative %: 60 %
Platelets: 318 10*3/uL (ref 150–400)
RBC: 4.29 MIL/uL (ref 3.87–5.11)
RDW: 14.9 % (ref 11.5–15.5)
WBC: 12.8 10*3/uL — ABNORMAL HIGH (ref 4.0–10.5)
nRBC: 0 % (ref 0.0–0.2)

## 2022-01-24 LAB — TROPONIN I (HIGH SENSITIVITY)
Troponin I (High Sensitivity): 3 ng/L (ref <18)
Troponin I (High Sensitivity): <2 ng/L (ref <18)

## 2022-01-24 NOTE — ED Triage Notes (Signed)
Pt reports with SHOB and chest pressure since 6 pm.

## 2022-01-24 NOTE — Discharge Instructions (Addendum)
Hoy lo atendieron en urgencias por su dolor en el pecho.  En el departamento de Publishing copy hicieron un electrocardiograma, una radiografa de trax y unos anlisis de laboratorio que fueron tranquilizadores.  En casa, mantente bien hidratado y toma la medicacin para la diabetes que te hemos recetado.  Consulte su MyChart en lnea para conocer los resultados de cualquier prueba que no haya dado resultado cuando sali del departamento de Multimedia programmer.  Haga un seguimiento con su mdico de Midwife en 2-3 das con respecto a su visita y niveles elevados de Dispensing optician. Si no tiene un mdico de atencin primaria al [place] gustara Teacher, adult education, puede Risk manager la informacin de atencin primaria de este paquete para programar una cita.  Regrese inmediatamente al departamento de emergencias si experimenta cualquiera de los siguientes sntomas: empeoramiento del dolor, dificultad para respirar o cualquier otro sntoma preocupante.  Gracias por visitar nuestro Departamento de Emergencias. Fue un placer atenderte hoy. ------  Today you were seen in the emergency department for your chest pain.    In the emergency department you had an EKG, chest x-ray, and lab work that was reassuring.    At home, please stay well-hydrated and take the medication for diabetes that we have prescribed you.    Check your MyChart online for the results of any tests that had not resulted by the time you left the emergency department.   Follow-up with your primary doctor in 2-3 days regarding your visit and elevated blood sugar.  If you do not have a primary doctor you would like to see you may use the droppage primary care information in this packet to set up an appointment.  Return immediately to the emergency department if you experience any of the following: Worsening pain, difficulty breathing, or any other concerning symptoms.    Thank you for visiting our Emergency Department. It was a pleasure  taking care of you today.

## 2022-01-24 NOTE — ED Provider Notes (Signed)
Start DEPT Provider Note   CSN: [id number] Arrival date & time: 01/23/22  2308     History  Chief Complaint  Patient presents with   Shortness of Breath    Tricia Williams is a 46 y.o. female.  History obtained via language line Spanish interpreter.  46 yo F with hx of DM not on medications who presents with chest pressure and headache after a desperate sensation at 6pm. At 10 pm yesterday started having shortness of breath. Also with cold feet and warm face while this was happening. No cough or fever. Says that now she is having the same desperate sensation but no chest pain or shortness of breath. Says her throat feels dry and that it is hard for air pass. Says that she took her blood pressure at home and it was 157/99. Says she was having chest pressure earlier. Says she was tested for similar symptoms 8-10 years ago and was told that it may be an allergy but can't remember which one it was to. Thinks this may be related to a new headband with feathers that she bought.   No hx of ACS, PE, cancer, or recent surgery. Was prescribed medication for her DM but has not been taking because she had to give it to her relative who was out of their medication.    Past Medical History:  Diagnosis Date   Gestational diabetes    Medical history non-contributory       Home Medications Prior to Admission medications   Medication Sig Start Date End Date Taking? Authorizing Provider  acetaminophen (TYLENOL) 325 MG tablet Take 2 tablets (650 mg total) by mouth every 4 (four) hours as needed (for pain scale < 4). 12/31/16   Winfrey, Alcario Drought, MD  amoxicillin (AMOXIL) 500 MG tablet Take 2 tablets (1,000 mg total) by mouth 2 (two) times daily. 03/11/17   Wardell Honour, MD  benzonatate (TESSALON) 100 MG capsule Take 1-2 capsules (100-200 mg total) by mouth 3 (three) times daily as needed for cough. 03/11/17   Wardell Honour, MD  ibuprofen (ADVIL,MOTRIN)  600 MG tablet Take 1 tablet (600 mg total) by mouth every 6 (six) hours. 12/31/16   Kathrene Alu, MD  ipratropium (ATROVENT) 0.03 % nasal spray Place 2 sprays into both nostrils 2 (two) times daily. 03/11/17   Wardell Honour, MD  loratadine-pseudoephedrine (CLARITIN-D 12-HOUR) 5-120 MG tablet Take 1 tablet by mouth 2 (two) times daily. 03/11/17   Wardell Honour, MD  ranitidine (ZANTAC) 150 MG tablet Take 1 tablet (150 mg total) by mouth 2 (two) times daily. 03/11/17   Wardell Honour, MD      Allergies    Patient has no known allergies.    Review of Systems   Review of Systems  Physical Exam Updated Vital Signs BP 131/89   Pulse (!) 110   Temp 98.4 F (36.9 C) (Oral)   Resp 18   LMP 01/09/2022 (Approximate)   SpO2 99%  Physical Exam Vitals and nursing note reviewed.  Constitutional:      General: She is not in acute distress.    Appearance: She is well-developed.  HENT:     Head: Normocephalic and atraumatic.     Right Ear: External ear normal.     Left Ear: External ear normal.     Nose: Nose normal.  Eyes:     Extraocular Movements: Extraocular movements intact.     Conjunctiva/sclera: Conjunctivae normal.  Pupils: Pupils are equal, round, and reactive to light.  Cardiovascular:     Rate and Rhythm: Normal rate and regular rhythm.     Heart sounds: No murmur heard. Pulmonary:     Effort: Pulmonary effort is normal. No respiratory distress.     Breath sounds: Normal breath sounds.  Abdominal:     General: Abdomen is flat. There is no distension.     Palpations: Abdomen is soft. There is no mass.     Tenderness: There is no abdominal tenderness. There is no guarding.  Musculoskeletal:        General: No swelling.     Cervical back: Normal range of motion and neck supple.     Right lower leg: No edema.     Left lower leg: No edema.  Skin:    General: Skin is warm and dry.     Capillary Refill: Capillary refill takes less than 2 seconds.  Neurological:      Mental Status: She is alert and oriented to person, place, and time. Mental status is at baseline.  Psychiatric:        Mood and Affect: Mood normal.     ED Results / Procedures / Treatments   Labs (all labs ordered are listed, but only abnormal results are displayed) Labs Reviewed  CBC WITH DIFFERENTIAL/PLATELET - Abnormal; Notable for the following components:      Result Value   WBC 12.8 (*)    Neutro Abs 7.8 (*)    All other components within normal limits  BASIC METABOLIC PANEL - Abnormal; Notable for the following components:   Glucose, Bld 221 (*)    All other components within normal limits  TROPONIN I (HIGH SENSITIVITY)  TROPONIN I (HIGH SENSITIVITY)    EKG EKG Interpretation  Date/Time:  Sunday January 23 2022 23:46:00 EDT Ventricular Rate:  104 PR Interval:  178 QRS Duration: 72 QT Interval:  319 QTC Calculation: 420 R Axis:   55 Text Interpretation: Sinus tachycardia Probable left atrial enlargement Low voltage, precordial leads Borderline T abnormalities, anterior leads When compared to 10/27/16 sinus tachycardia now present Confirmed by Margaretmary Eddy [phone]) on 01/24/2022 11:57:07 AM  Radiology DG Chest 2 View  Result Date: 01/24/2022 CLINICAL DATA:  Shortness of breath for few days EXAM: CHEST - 2 VIEW COMPARISON:  None Available. FINDINGS: The heart size and mediastinal contours are within normal limits. Both lungs are clear. The visualized skeletal structures are unremarkable. IMPRESSION: No active cardiopulmonary disease. Electronically Signed   By: Inez Catalina M.D.   On: 01/24/2022 00:07    Procedures Procedures   Medications Ordered in ED Medications - No data to display  ED Course/ Medical Decision Making/ A&P                           Medical Decision Making  Tricia Williams is a 46 y.o. female with comorbidities that complicate the patient evaluation including *** who presents with chief complaint of ***.  This patient presents to  the ED for concern of complaints listed in HPI, this involves an extensive number of treatment options, and is a complaint that carries with it a high risk of complications and morbidity.   Initial Ddx:  ***   MDM:  ***  Plan:  ***  ED Summary:  ***  Dispo: {Disposition:28069}   Additional history obtained from {Additional History:28067} Records reviewed {Records Reviewed:28068} The following labs were independently interpreted: {labs interpreted:28064} and  show {lab findings:28250} I independently reviewed the following imaging with scope of interpretation limited to determining acute life threatening conditions related to emergency care: {imaging interpreted:28065}, which revealed {No acute abnormality:28066}  I personally reviewed and interpreted cardiac monitoring: {cardiac monitoring:28251} I personally reviewed and interpreted the pt's EKG: see above for interpretation  I have reviewed the patients home medications and made adjustments as needed Consults: {Consultants:28063} Social Determinants of health:  ***  {Document critical care time when appropriate:1} {Document POCUS if Performed:1}   {Document critical care time when appropriate:1} {Document review of labs and clinical decision tools ie heart score, Chads2Vasc2 etc:1}  {Document your independent review of radiology images, and any outside records:1} {Document your discussion with family members, caretakers, and with consultants:1} {Document social determinants of health affecting pt's care:1} {Document your decision making why or why not admission, treatments were needed:1} Final Clinical Impression(s) / ED Diagnoses Final diagnoses:  None    Rx / DC Orders ED Discharge Orders     None

## 2023-01-10 ENCOUNTER — Other Ambulatory Visit: Payer: Self-pay | Admitting: Obstetrics and Gynecology

## 2023-01-10 DIAGNOSIS — Z1231 Encounter for screening mammogram for malignant neoplasm of breast: Secondary | ICD-10-CM

## 2023-03-16 ENCOUNTER — Ambulatory Visit: Payer: Self-pay | Admitting: Hematology and Oncology

## 2023-03-16 ENCOUNTER — Ambulatory Visit
Admission: RE | Admit: 2023-03-16 | Discharge: 2023-03-16 | Disposition: A | Discharge status: Home / Self Care | Payer: No Typology Code available for payment source | Source: Ambulatory Visit | Attending: Obstetrics and Gynecology | Admitting: Obstetrics and Gynecology

## 2023-03-16 VITALS — BP 110/80 | Wt 156.1 lb

## 2023-03-16 DIAGNOSIS — Z1211 Encounter for screening for malignant neoplasm of colon: Secondary | ICD-10-CM

## 2023-03-16 DIAGNOSIS — Z1231 Encounter for screening mammogram for malignant neoplasm of breast: Secondary | ICD-10-CM

## 2023-03-16 NOTE — Patient Instructions (Signed)
Taught Tricia Williams about self breast awareness and gave educational materials to take home. Patient did not need a Pap smear today due to last Pap smear was in 12/28/2022 per patient.  Let her know BCCCP will cover Pap smears every 5 years unless has a history of abnormal Pap smears. [facility] to the [facility] for diagnostic mammogram. Appointment scheduled for 03/16/2023. Patient aware of appointment and will be there. Let patient know will follow up with her within the next couple weeks with results. Tricia Williams verbalized understanding.  Pascal Lux, NP 11:06 AM

## 2023-03-16 NOTE — Progress Notes (Signed)
Ms. ANALIYAH BOULTON is a 47 y.o. female who presents to [facility] clinic today with no complaints.    Pap Smear: Pap not smear completed today. Last Pap smear was 12/28/2022 and was normal. Per patient has no history of an abnormal Pap smear. Last Pap smear result is available in Epic.   Physical exam: Breasts Breasts symmetrical. No skin abnormalities bilateral breasts. No nipple retraction bilateral breasts. No nipple discharge bilateral breasts. No lymphadenopathy. No lumps palpated bilateral breasts.     MS DIGITAL DIAG TOMO UNI RIGHT  Result Date: 01/18/2021 CLINICAL DATA:  Patient was recalled from screening mammogram for a possible mass in the right breast. EXAM: DIGITAL DIAGNOSTIC UNILATERAL RIGHT MAMMOGRAM WITH TOMOSYNTHESIS; ULTRASOUND RIGHT BREAST LIMITED TECHNIQUE: Right digital diagnostic mammography and breast tomosynthesis was performed. Targeted ultrasound examination of the right breast was performed COMPARISON:  Previous exam(s). ACR Breast Density Category c: The breast tissue is heterogeneously dense, which may obscure small masses. FINDINGS: Additional imaging of the right breast was performed. There is persistence of a mass in the upper-outer retroareolar region of the breast. There are no malignant type microcalcifications. On physical exam, I do not palpate a mass in the upper outer retroareolar region of the right breast. Targeted ultrasound is performed, showing probable benign cluster of cysts in the right breast at 10 o'clock in the retroareolar region measuring 4 x 3 x 8 mm. There is also likely a benign cyst in the right breast at 10 o'clock 3 cm from the nipple measuring 6 x 3 x 4 mm. IMPRESSION: Probable benign cluster of cysts in the right breast at 10 o'clock in the retroareolar region and in the right breast at 10 o'clock 3 cm from the nipple. RECOMMENDATION: Short-term interval follow-up right breast ultrasound in 6 months is recommended. I have discussed the findings  and recommendations with the patient. If applicable, a reminder letter will be sent to the patient regarding the next appointment. BI-RADS CATEGORY  3: Probably benign. Electronically Signed   By: Baird Lyons M.D.   On: 01/18/2021 11:46  MM 3D SCREEN BREAST BILATERAL  Result Date: 12/30/2020 CLINICAL DATA:  Screening. EXAM: DIGITAL SCREENING BILATERAL MAMMOGRAM WITH TOMOSYNTHESIS AND CAD TECHNIQUE: Bilateral screening digital craniocaudal and mediolateral oblique mammograms were obtained. Bilateral screening digital breast tomosynthesis was performed. The images were evaluated with computer-aided detection. COMPARISON:  Previous exam(s). ACR Breast Density Category c: The breast tissue is heterogeneously dense, which may obscure small masses. FINDINGS: In the right breast, a possible mass warrants further evaluation. In the left breast, no findings suspicious for malignancy. IMPRESSION: Further evaluation is suggested for a possible mass in the right breast. RECOMMENDATION: Diagnostic mammogram and possibly ultrasound of the right breast. (Code:[id number]) The patient will be contacted regarding the findings, and additional imaging will be scheduled. BI-RADS CATEGORY  0: Incomplete. Need additional imaging evaluation and/or prior mammograms for comparison. Electronically Signed   By: Frederico Hamman M.D.   On: 12/30/2020 10:14   MS DIGITAL SCREENING TOMO BILATERAL  Result Date: 10/08/2019 CLINICAL DATA:  Screening. EXAM: DIGITAL SCREENING BILATERAL MAMMOGRAM WITH TOMO AND CAD COMPARISON:  None. ACR Breast Density Category c: The breast tissue is heterogeneously dense, which may obscure small masses FINDINGS: There are no findings suspicious for malignancy. Images were processed with CAD. IMPRESSION: No mammographic evidence of malignancy. A result letter of this screening mammogram will be mailed directly to the patient. RECOMMENDATION: Screening mammogram in one year. (Code:[id number]) BI-RADS CATEGORY  1:  Negative. Electronically Signed  By: Edwin Cap M.D.   On: 10/08/2019 07:40      Pelvic/Bimanual Pap is not indicated today    Smoking History: Patient has never smoked and was not referred to quit line.    Patient Navigation: Patient education provided. Access to services provided for patient through B[facility]CP program. Natale Lay interpreter provided. No transportation provided   Colorectal Cancer Screening: Per patient has never had colonoscopy completed No complaints today.    Breast and Cervical Cancer Risk Assessment: Patient does not have family history of breast cancer, known genetic mutations, or radiation treatment to the chest before age 13. Patient does not have history of cervical dysplasia, immunocompromised, or DES exposure in-utero.  Risk Scores as of Encounter on 03/16/2023     Dondra Spry           5-year 0.69%   Lifetime 7.79%   This patient is [place]/Lat[place] but has no documented birth country, so the [place] model used data from [place] patients to calculate their risk score. Document a birth country in the Demographics activity for a more accurate score.         Last calculated by Meryl Dare, CMA on 03/16/2023 at 10:55 AM        A: BCCCP exam without pap smear No complaints with benign exam.   P: Referred patient to [facility] for a screening mammogram. Appointment scheduled 03/16/2023.  Ilda Basset A, NP 03/16/2023 11:05 AM

## 2023-03-16 NOTE — Addendum Note (Signed)
Addended by: Lucilla Lame E on: 03/16/2023 11:28 AM   Modules accepted: Orders

## 2024-02-28 ENCOUNTER — Other Ambulatory Visit: Payer: Self-pay | Admitting: Obstetrics & Gynecology

## 2024-02-28 DIAGNOSIS — Z1231 Encounter for screening mammogram for malignant neoplasm of breast: Secondary | ICD-10-CM

## 2024-03-21 ENCOUNTER — Ambulatory Visit
Admission: RE | Admit: 2024-03-21 | Discharge: 2024-03-21 | Disposition: A | Discharge status: Home / Self Care | Source: Ambulatory Visit | Attending: Obstetrics and Gynecology | Admitting: Obstetrics and Gynecology

## 2024-03-21 DIAGNOSIS — Z1231 Encounter for screening mammogram for malignant neoplasm of breast: Secondary | ICD-10-CM

## 2024-03-28 ENCOUNTER — Ambulatory Visit: Payer: Self-pay | Admitting: Obstetrics & Gynecology
